# Patient Record
Sex: Female | Born: 1960 | Race: Black or African American | Hispanic: No | Smoking: Current every day smoker
Health system: Southern US, Community
[De-identification: ages and names within clinical notes are randomized; demographics above are authoritative.]

## PROBLEM LIST (undated history)

## (undated) DIAGNOSIS — I10 Essential (primary) hypertension: Secondary | ICD-10-CM

## (undated) DIAGNOSIS — I714 Abdominal aortic aneurysm, without rupture, unspecified: Secondary | ICD-10-CM

## (undated) DIAGNOSIS — R51 Headache: Secondary | ICD-10-CM

## (undated) DIAGNOSIS — J449 Chronic obstructive pulmonary disease, unspecified: Secondary | ICD-10-CM

## (undated) DIAGNOSIS — K56609 Unspecified intestinal obstruction, unspecified as to partial versus complete obstruction: Secondary | ICD-10-CM

## (undated) HISTORY — PX: COLOSTOMY REVERSAL: SHX5782

## (undated) HISTORY — PX: COLOSTOMY: SHX63

## (undated) HISTORY — PX: ABDOMINAL AORTIC ANEURYSM REPAIR: SUR1152

---

## 2003-08-12 ENCOUNTER — Other Ambulatory Visit: Payer: Self-pay

## 2012-01-03 ENCOUNTER — Emergency Department: Payer: Self-pay | Admitting: Emergency Medicine

## 2012-01-03 ENCOUNTER — Encounter (HOSPITAL_COMMUNITY): Payer: Self-pay | Admitting: Family Medicine

## 2012-01-03 ENCOUNTER — Inpatient Hospital Stay (HOSPITAL_COMMUNITY)
Admission: AD | Admit: 2012-01-03 | Discharge: 2012-01-05 | DRG: 304 | Disposition: A | Discharge status: Home / Self Care | Payer: Self-pay | Source: Other Acute Inpatient Hospital | Attending: Internal Medicine | Admitting: Internal Medicine

## 2012-01-03 DIAGNOSIS — E876 Hypokalemia: Secondary | ICD-10-CM | POA: Diagnosis present

## 2012-01-03 DIAGNOSIS — I10 Essential (primary) hypertension: Secondary | ICD-10-CM

## 2012-01-03 DIAGNOSIS — F1020 Alcohol dependence, uncomplicated: Secondary | ICD-10-CM

## 2012-01-03 DIAGNOSIS — J690 Pneumonitis due to inhalation of food and vomit: Secondary | ICD-10-CM | POA: Diagnosis present

## 2012-01-03 DIAGNOSIS — F10939 Alcohol use, unspecified with withdrawal, unspecified: Secondary | ICD-10-CM | POA: Diagnosis present

## 2012-01-03 DIAGNOSIS — Z91199 Patient's noncompliance with other medical treatment and regimen due to unspecified reason: Secondary | ICD-10-CM

## 2012-01-03 DIAGNOSIS — F10231 Alcohol dependence with withdrawal delirium: Secondary | ICD-10-CM

## 2012-01-03 DIAGNOSIS — F10239 Alcohol dependence with withdrawal, unspecified: Secondary | ICD-10-CM | POA: Diagnosis present

## 2012-01-03 DIAGNOSIS — Z9119 Patient's noncompliance with other medical treatment and regimen: Secondary | ICD-10-CM

## 2012-01-03 HISTORY — DX: Headache: R51

## 2012-01-03 HISTORY — DX: Essential (primary) hypertension: I10

## 2012-01-03 LAB — CBC WITH DIFFERENTIAL/PLATELET
Basophil #: 0 10*3/uL (ref 0.0–0.1)
Basophil %: 0.2 %
HCT: 48.5 % — ABNORMAL HIGH (ref 35.0–47.0)
HGB: 15 g/dL (ref 12.0–16.0)
MCH: 24.9 pg — ABNORMAL LOW (ref 26.0–34.0)
MCHC: 31 g/dL — ABNORMAL LOW (ref 32.0–36.0)
Neutrophil #: 12.2 10*3/uL — ABNORMAL HIGH (ref 1.4–6.5)
Platelet: 151 10*3/uL (ref 150–440)
RDW: 20.8 % — ABNORMAL HIGH (ref 11.5–14.5)

## 2012-01-03 LAB — TSH: TSH: 0.326 u[IU]/mL — ABNORMAL LOW (ref 0.350–4.500)

## 2012-01-03 LAB — RAPID URINE DRUG SCREEN, HOSP PERFORMED
Amphetamines: NOT DETECTED
Cocaine: NOT DETECTED
Opiates: NOT DETECTED
Tetrahydrocannabinol: NOT DETECTED

## 2012-01-03 LAB — DRUG SCREEN, URINE
Benzodiazepine, Ur Scrn: NEGATIVE (ref ?–200)
Cannabinoid 50 Ng, Ur ~~LOC~~: NEGATIVE (ref ?–50)
Methadone, Ur Screen: NEGATIVE (ref ?–300)
Opiate, Ur Screen: NEGATIVE (ref ?–300)
Phencyclidine (PCP) Ur S: NEGATIVE (ref ?–25)

## 2012-01-03 LAB — CBC
Hemoglobin: 14.6 g/dL (ref 12.0–15.0)
MCH: 24.7 pg — ABNORMAL LOW (ref 26.0–34.0)
RBC: 5.91 MIL/uL — ABNORMAL HIGH (ref 3.87–5.11)
WBC: 11 10*3/uL — ABNORMAL HIGH (ref 4.0–10.5)

## 2012-01-03 LAB — URINALYSIS, COMPLETE
Glucose,UR: 50 mg/dL (ref 0–75)
Hyaline Cast: 4
Leukocyte Esterase: NEGATIVE
Ph: 7 (ref 4.5–8.0)
Protein: >=500
Specific Gravity: 1.018 (ref 1.003–1.030)
WBC UR: 2 /HPF (ref 0–5)

## 2012-01-03 LAB — COMPREHENSIVE METABOLIC PANEL
Alkaline Phosphatase: 103 U/L (ref 50–136)
Bilirubin,Total: 1 mg/dL (ref 0.2–1.0)
Calcium, Total: 9.5 mg/dL (ref 8.5–10.1)
EGFR (Non-African Amer.): >60
Glucose: 214 mg/dL — ABNORMAL HIGH (ref 65–99)
SGPT (ALT): 15 U/L

## 2012-01-03 LAB — TROPONIN I: Troponin-I: 0.02 ng/mL

## 2012-01-03 LAB — PROTIME-INR: INR: 1

## 2012-01-03 LAB — ETHANOL: Ethanol: <3 mg/dL

## 2012-01-03 LAB — CK TOTAL AND CKMB (NOT AT ARMC): CK-MB: 1 ng/mL (ref 0.5–3.6)

## 2012-01-03 MED ORDER — ACETAMINOPHEN 650 MG RE SUPP: 650.0000 mg | Freq: Four times a day (QID) | RECTAL | Status: DC | PRN

## 2012-01-03 MED ORDER — HYDRALAZINE HCL 20 MG/ML IJ SOLN
10.0000 mg | Freq: Three times a day (TID) | INTRAMUSCULAR | Status: DC | PRN
  Administered 2012-01-03 – 2012-01-04 (×3): 10 mg via INTRAVENOUS
  Filled 2012-01-03 (×4): qty 0.5

## 2012-01-03 MED ORDER — SODIUM CHLORIDE 0.9 % IJ SOLN
3.0000 mL | Freq: Two times a day (BID) | INTRAMUSCULAR | Status: DC
  Administered 2012-01-03 – 2012-01-04 (×2): 3 mL via INTRAVENOUS
  Administered 2012-01-04: 09:00:00 via INTRAVENOUS

## 2012-01-03 MED ORDER — ONDANSETRON HCL 4 MG PO TABS: 4.0000 mg | ORAL_TABLET | Freq: Four times a day (QID) | ORAL | Status: DC | PRN

## 2012-01-03 MED ORDER — IPRATROPIUM BROMIDE 0.02 % IN SOLN
0.5000 mg | Freq: Four times a day (QID) | RESPIRATORY_TRACT | Status: DC
  Administered 2012-01-03 – 2012-01-05 (×7): 0.5 mg via RESPIRATORY_TRACT
  Filled 2012-01-03 (×7): qty 2.5

## 2012-01-03 MED ORDER — ONDANSETRON HCL 4 MG/2ML IJ SOLN: 4.0000 mg | Freq: Four times a day (QID) | INTRAMUSCULAR | Status: DC | PRN

## 2012-01-03 MED ORDER — THIAMINE HCL 100 MG/ML IJ SOLN
INTRAVENOUS | Status: DC
  Filled 2012-01-03 (×3): qty 1000

## 2012-01-03 MED ORDER — ALBUTEROL SULFATE (5 MG/ML) 0.5% IN NEBU
2.5000 mg | INHALATION_SOLUTION | RESPIRATORY_TRACT | Status: DC | PRN
  Administered 2012-01-03: 2.5 mg via RESPIRATORY_TRACT
  Filled 2012-01-03 (×2): qty 0.5

## 2012-01-03 MED ORDER — LORAZEPAM 2 MG/ML IJ SOLN
1.0000 mg | INTRAMUSCULAR | Status: DC | PRN
  Administered 2012-01-03: 1 mg via INTRAVENOUS
  Filled 2012-01-03: qty 1

## 2012-01-03 MED ORDER — AMLODIPINE BESYLATE 10 MG PO TABS
10.0000 mg | ORAL_TABLET | Freq: Every day | ORAL | Status: DC
  Administered 2012-01-03 – 2012-01-05 (×3): 10 mg via ORAL
  Filled 2012-01-03 (×3): qty 1

## 2012-01-03 MED ORDER — LEVOFLOXACIN IN D5W 500 MG/100ML IV SOLN
500.0000 mg | INTRAVENOUS | Status: DC
  Administered 2012-01-03 – 2012-01-04 (×2): 500 mg via INTRAVENOUS
  Filled 2012-01-03 (×2): qty 100

## 2012-01-03 MED ORDER — THIAMINE HCL 100 MG/ML IJ SOLN
Freq: Once | INTRAVENOUS | Status: AC
  Administered 2012-01-03: 13:00:00 via INTRAVENOUS
  Filled 2012-01-03 (×2): qty 1000

## 2012-01-03 MED ORDER — ACETAMINOPHEN 325 MG PO TABS
650.0000 mg | ORAL_TABLET | Freq: Four times a day (QID) | ORAL | Status: DC | PRN
  Administered 2012-01-04 – 2012-01-05 (×3): 650 mg via ORAL
  Filled 2012-01-03 (×3): qty 2

## 2012-01-03 MED ORDER — ENOXAPARIN SODIUM 40 MG/0.4ML ~~LOC~~ SOLN
40.0000 mg | Freq: Every day | SUBCUTANEOUS | Status: DC
  Administered 2012-01-03 – 2012-01-05 (×3): 40 mg via SUBCUTANEOUS
  Filled 2012-01-03 (×3): qty 0.4

## 2012-01-03 MED ORDER — CLONIDINE HCL 0.2 MG/24HR TD PTWK
0.4000 mg | MEDICATED_PATCH | TRANSDERMAL | Status: DC
  Administered 2012-01-03: 0.4 mg via TRANSDERMAL
  Filled 2012-01-03: qty 2

## 2012-01-03 MED ORDER — WHITE PETROLATUM GEL
Status: AC
  Administered 2012-01-03: 17:00:00
  Filled 2012-01-03: qty 5

## 2012-01-03 NOTE — H&P (Signed)
Jamie Roman MRN: [id number] DOB/AGE: [birth year] 51 y.o. Primary Care Physician:Pcp Not In System Admit date: 01/03/2012 Chief Complaint: Altered mental status   HPI: 51 year old female transferred from [facility], after being found unresponsive on the floor by her daughter. He is very somnolent and difficult to arouse, she does state that she drinks beer on a daily basis. However she is unable to quantify for me how much she drinks. Apparently the patient also had a bottle in her hand which was empty. She denies taking any pills yesterday. She denies any chest pain any shortness of breath her blood pressure was found to be greater than 200 systolic when she presented to [facility]. Workup in the ER included a CT scan of the head that did not show any evidence of mass or hemorrhage or acute infarction. He was also found to have a low-grade fever of 99.4 and tachycardic at a rate of 100. Was found to have some slurred speech yesterday however her speech is clear today. Ascending EtOH level was less than 3 She is obeying commands. She denies any headache or blurry vision.  Past medical history #1 hypertension #2 alcohol dependence  Past surgical history #1 laparotomy scar that the patient is unable to tell me what the surgery was for  Prior to Admission medications   Not on File    Allergies: Allergies not on file  No family history on file.  Social History:  Drinks on a daily basis Does not smoke       ROS complete 14 point review of systems was done with pertinent positives documented in history of present illness Review of Systems  Constitutional: Negative.  HENT: Negative.  Eyes: Negative.  Respiratory: Positive for shortness of breath and wheezing.  Gastrointestinal: Negative.  Genitourinary: Negative.  Musculoskeletal: Negative.  Skin: Negative.  Neurological: confusion, no slurred speech today  Endo/Heme/Allergies: Negative.    Psychiatric/Behavioral: Negative.       PHYSICAL EXAM:  Constitutional: He appears well-developed and well-nourished.  HENT:  Head: Normocephalic and atraumatic.  Right Ear: External ear normal.  Left Ear: External ear normal.  Nose: Nose normal.  Mouth/Throat: Oropharynx is clear and moist.  Eyes: Conjunctivae and EOM are normal. Pupils are equal, round, and reactive to light.  Neck: Normal range of motion. Neck supple.  Cardiovascular: Normal rate, regular rhythm, normal heart sounds and intact distal pulses.  Respiratory: Effort normal and breath sounds normal.  GI: Soft. Bowel sounds are normal.  Musculoskeletal: Normal range of motion. He exhibits no edema and no tenderness.  Neurological: He is alert. He has normal strength. No cranial nerve deficit or sensory deficit. Coordination and gait abnormal.  Skin: Skin is warm and dry.  Psychiatric: He has a normal mood and affect. His behavior is normal. Judgment and thought content normal.       Labs at [facility] #1 BUN 7 creatinine 0.76 sodium 134 potassium 3.2 chloride 94 bicarbonate 26 calcium 9.5 total bilirubin 1.0 alkaline phosphatase 103 ALT 15 AST 32 Anion gap 14 ethanol level less than 3  WBC 13.5 hemoglobin 15 hematocrit 40.5 platelet count 151  INR 1.0 Troponin 0.02    X-ray shows bilateral diffuse interstitial thickening representing chronic interstitial disease but superimposed mild interstitial edema versus pneumonitis cannot be excluded  Impression:  Principal Problem:  *HTN (hypertension) Active Problems:  Alcohol dependence  possible  aspiration pneumonitis    Plan: #1 the patient has been admitted to step down because of her hypertensive urgency #  2 she will be started on CIWA protocol for her alcohol dependence #3 her hypertension will be treated with by mouth Norvasc when necessary hydralazine #4 we'll obtain a urine drug screen #5 check electrolytes namely magnesium, potassium,  renal function #6 her abdominal pain appears to be nonspecific however we will check another lipase and liver function tests #7 altered mental status likely secondary to delerium tremens #8 leukocytosis secondary to aspiration pneumonitis we'll start the patient on Levaquin      Cj Edgell 01/03/2012, 9:59 AM

## 2012-01-03 NOTE — Progress Notes (Signed)
Notified Dr Susie Cassette [phone] that patient's BP remained elevated even after receiving Norvasc and prn hydralazine. Also informed MD that replacement IV fluids were discontinued and that nothing is available prn for agitation. Verbal order was given for clonidine, ativan and banana bag.

## 2012-01-03 NOTE — Progress Notes (Signed)
  Echocardiogram 2D Echocardiogram has been performed.  Jamie Roman, Real Cons 01/03/2012, 3:28 PM

## 2012-01-04 ENCOUNTER — Inpatient Hospital Stay (HOSPITAL_COMMUNITY): Payer: Self-pay

## 2012-01-04 DIAGNOSIS — F10231 Alcohol dependence with withdrawal delirium: Secondary | ICD-10-CM

## 2012-01-04 DIAGNOSIS — I10 Essential (primary) hypertension: Secondary | ICD-10-CM

## 2012-01-04 DIAGNOSIS — J690 Pneumonitis due to inhalation of food and vomit: Secondary | ICD-10-CM

## 2012-01-04 LAB — CBC
HCT: 42 % (ref 36.0–46.0)
Hemoglobin: 13.7 g/dL (ref 12.0–15.0)
MCH: 25.2 pg — ABNORMAL LOW (ref 26.0–34.0)
MCHC: 32.6 g/dL (ref 30.0–36.0)
MCV: 77.3 fL — ABNORMAL LOW (ref 78.0–100.0)

## 2012-01-04 MED ORDER — CLONIDINE HCL 0.3 MG/24HR TD PTWK
0.3000 mg | MEDICATED_PATCH | TRANSDERMAL | Status: DC
  Administered 2012-01-04: 0.3 mg via TRANSDERMAL
  Filled 2012-01-04: qty 1

## 2012-01-04 MED ORDER — FOLIC ACID 1 MG PO TABS
1.0000 mg | ORAL_TABLET | Freq: Every day | ORAL | Status: DC
  Administered 2012-01-04 – 2012-01-05 (×2): 1 mg via ORAL
  Filled 2012-01-04 (×2): qty 1

## 2012-01-04 MED ORDER — ADULT MULTIVITAMIN W/MINERALS CH
1.0000 | ORAL_TABLET | Freq: Every day | ORAL | Status: DC
  Administered 2012-01-04 – 2012-01-05 (×2): 1 via ORAL
  Filled 2012-01-04 (×2): qty 1

## 2012-01-04 MED ORDER — PIPERACILLIN-TAZOBACTAM 3.375 G IVPB
3.3750 g | Freq: Three times a day (TID) | INTRAVENOUS | Status: DC
  Administered 2012-01-04 – 2012-01-05 (×2): 3.375 g via INTRAVENOUS
  Filled 2012-01-04 (×6): qty 50

## 2012-01-04 MED ORDER — VITAMIN B-1 100 MG PO TABS
100.0000 mg | ORAL_TABLET | Freq: Every day | ORAL | Status: DC
  Administered 2012-01-04 – 2012-01-05 (×2): 100 mg via ORAL
  Filled 2012-01-04 (×2): qty 1

## 2012-01-04 NOTE — Care Management Note (Unsigned)
    Page 1 of 1   01/04/2012     3:58:00 PM   CARE MANAGEMENT NOTE 01/04/2012  Patient:  Jamie Roman, Jamie Roman   Account Number:  [id number]  Date Initiated:  01/04/2012  Documentation initiated by:  Letha Cape  Subjective/Objective Assessment:   dx Malignant HTN, DT's  admit- lives with boyfriend.     Action/Plan:   Anticipated DC Date:  01/06/2012   Anticipated DC Plan:  HOME/SELF CARE      DC Planning Services  CM consult      Choice offered to / List presented to:             Status of service:  In process, will continue to follow Medicare Important Message given?   (If response is "NO", the following Medicare IM given date fields will be blank) Date Medicare IM given:   Date Additional Medicare IM given:    Discharge Disposition:    Per UR Regulation:  Reviewed for med. necessity/level of care/duration of stay  If discussed at Long Length of Stay Meetings, dates discussed:    Comments:  01/04/12 15:24 Letha Cape RN, BSN [phone] patient lives with boyfriend, patient is eligible for med ast if needed. Patient states she lives in [place] and she used to go to [facility] in [place] [phone], her orange card has expired, patient lives is [place].  Patient states she has transportation at discharge.  Called the [facility] and they are closed, they closed at 1 pm on Fridays. Patient will need to see if she can get set back up with this clinic.   There is also a Artist in [place] [phone] and [facility] in [phone].

## 2012-01-04 NOTE — Progress Notes (Signed)
Subjective: Pt sitting up in bed this am, denies CP,  Denies SOB, alert appropriate. Abd. pain better, tolerating po well. Objective: Vital signs in last 24 hours: Temp:  [97.7 F (36.5 C)-100.1 F (37.8 C)] 99.5 F (37.5 C) (06/07 0410) Pulse Rate:  [85-121] 96  (06/07 0700) Resp:  [20-25] 22  (06/07 0700) BP: (147-206)/(65-87) 147/68 mmHg (06/07 0700) SpO2:  [86 %-96 %] 94 % (06/07 0700) Weight:  [71.1 kg (156 lb 12 oz)-77 kg (169 lb 12.1 oz)] 71.1 kg (156 lb 12 oz) (06/07 0030) Last BM Date: 01/02/12 Intake/Output from previous day: 06/06 0701 - 06/07 0700 In: 1075 [I.V.:975; IV Piggyback:100] Out: 250 [Urine:250] Intake/Output this shift:      General Appearance:    Alert and oriented x3, cooperative, no distress, appears stated age  Lungs:     Clear to auscultation bilaterally, respirations unlabored   Heart:    Regular rate and rhythm, S1 and S2 normal, no murmur, rub   or gallop  Abdomen:     Soft, non-tender, bowel sounds present, non distended,    no masses, no organomegaly  Extremities:  no cyanosis or edema, no tremors  Neurologic:   CNII-XII intact, normal strength, sensation and reflexes    throughout    Weight change:   Intake/Output Summary (Last 24 hours) at 01/04/12 0742 Last data filed at 01/04/12 0500  Gross per 24 hour  Intake   1075 ml  Output    250 ml  Net    825 ml    Lab Results:  No results found for this basename: NA:2,K:2,CL:2,CO2:2,GLUCOSE:2,BUN:2,CREATININE:2,CALCIUM:2 in the last 72 hours  Basename 01/04/12 0345 01/03/12 1210  WBC 12.8* 11.0*  HGB 13.7 14.6  HCT 42.0 45.2  PLT 138* 134*  MCV 77.3* 76.5*   PT/INR No results found for this basename: LABPROT:2,INR:2 in the last 72 hours ABG No results found for this basename: PHART:2,PCO2:2,PO2:2,HCO3:2 in the last 72 hours  Micro Results: Recent Results (from the past 240 hour(s))  MRSA PCR SCREENING     Status: Normal   Collection Time   01/03/12  9:06 AM      Component  Value Range Status Comment   MRSA by PCR NEGATIVE  NEGATIVE  Final    Studies/Results:  2D ECHO Study Conclusions  - Left ventricle: The cavity size was normal. There was moderate concentric hypertrophy. Systolic function was normal. The estimated ejection fraction was in the range of 55% to 60%. Wall motion was normal; there were no regional wall motion abnormalities. - Aortic valve: Mild to moderate regurgitation. - Left atrium: The atrium was mildly to moderately dilated.    No results found. Medications:  Scheduled Meds:   . amLODipine  10 mg Oral Daily  . cloNIDine  0.4 mg Transdermal Weekly  . enoxaparin  40 mg Subcutaneous Daily  . ipratropium  0.5 mg Nebulization Q6H  . levofloxacin (LEVAQUIN) IV  500 mg Intravenous Q24H  . general admission iv infusion   Intravenous Once  . general admission iv infusion   Intravenous Q24H  . sodium chloride  3 mL Intravenous Q12H  . white petrolatum       Continuous Infusions:  PRN Meds:.acetaminophen, acetaminophen, albuterol, hydrALAZINE, LORazepam, ondansetron (ZOFRAN) IV, ondansetron Assessment/Plan: Patient Active Hospital Problem List: HTN (hypertension), Malignant (01/03/2012) - better controlled d/c IVF, change clonidine patch to 0.3 weekly and continue norvasc -echo with EF 55-60%, and no wall motion abnormalities - transfer to tele  Alcohol dependence with Probable DTs -no  evidence of DTs THIS am -change to PO Thiamine folate and MVIs -SW for resources to quit alcohol - mobilize, PT/OT consult Leukocytosis/?Asp. Pneumonitis vs stress demargination - obtain CXR, UA - continue current abx and follo   dispo - transfer to tele    LOS: 1 day   Jream Broyles C 01/04/2012, 7:42 AM

## 2012-01-04 NOTE — Progress Notes (Signed)
Pt placed in wheelchair, vss, no c/o of pain, report given to  [facility] in 5500, pt escorted by NT Gaylyn Rong.

## 2012-01-04 NOTE — Progress Notes (Addendum)
ANTIBIOTIC CONSULT NOTE - INITIAL  Pharmacy Consult for Zosyn Indication: Leukocytosis/?Asp. Pneumonitis   No Known Allergies  Patient Measurements: Height: 5\' 2"  (157.5 cm) Weight: 156 lb 12 oz (71.1 kg) IBW/kg (Calculated) : 50.1    Vital Signs: Temp: 98.1 F (36.7 C) (06/07 1431) Temp src: Oral (06/07 1431) BP: 145/68 mmHg (06/07 1431) Pulse Rate: 99  (06/07 1431) Intake/Output from previous day: 06/06 0701 - 06/07 0700 In: 1075 [I.V.:975; IV Piggyback:100] Out: 250 [Urine:250] Intake/Output from this shift: Total I/O In: 240 [P.O.:240] Out: -   Labs:  Basename 01/04/12 0345 01/03/12 1210  WBC 12.8* 11.0*  HGB 13.7 14.6  PLT 138* 134*  LABCREA -- --  CREATININE -- --   CrCl is unknown because no creatinine reading has been taken. No results found for this basename: VANCOTROUGH:2,VANCOPEAK:2,VANCORANDOM:2,GENTTROUGH:2,GENTPEAK:2,GENTRANDOM:2,TOBRATROUGH:2,TOBRAPEAK:2,TOBRARND:2,AMIKACINPEAK:2,AMIKACINTROU:2,AMIKACIN:2, in the last 72 hours   Microbiology: Recent Results (from the past 720 hour(s))  MRSA PCR SCREENING     Status: Normal   Collection Time   01/03/12  9:06 AM      Component Value Range Status Comment   MRSA by PCR NEGATIVE  NEGATIVE  Final     Medical History: Past Medical History  Diagnosis Date  . Hypertension   . Headache     Medications:  Prescriptions prior to admission  Medication Sig Dispense Refill  . Aspirin-Salicylamide-Caffeine (BC HEADACHE POWDER PO) Take 1 packet by mouth daily as needed. For pain       Scheduled:    . amLODipine  10 mg Oral Daily  . cloNIDine  0.3 mg Transdermal Weekly  . enoxaparin  40 mg Subcutaneous Daily  . folic acid  1 mg Oral Daily  . ipratropium  0.5 mg Nebulization Q6H  . multivitamin with minerals  1 tablet Oral Daily  . sodium chloride  3 mL Intravenous Q12H  . thiamine  100 mg Oral Daily  . DISCONTD: cloNIDine  0.4 mg Transdermal Weekly  . DISCONTD: levofloxacin (LEVAQUIN) IV  500 mg  Intravenous Q24H  . DISCONTD: general admission iv infusion   Intravenous Q24H   Assessment: 51 y.o. Female with PMH of HTN and alcohol dependence transferred from [facility] hospital where she was admitted for AMS, HTN and leukocytosis.  She received Levaquin 500mg  IV on 6/6 and 01/04/12.  Now antibiotic changed to Zosyn.  No SCr result available.  No hx of renal insufficiency noted.   Goal of Therapy:  treat infection  Plan:  Zosyn 3.375 g IV q8hr (infuse each dose over 4 hours).  Will check serum creatinine in AM to evaluate renal function and adjust Zosyn dose if necessary.   Jamie Roman, RPh Clinical Pharmacist 01/04/2012,5:16 PM    6.8.13 Renal function stable Pharmacy to sign off  Thank you. Okey Regal, PharmD

## 2012-01-04 NOTE — Progress Notes (Signed)
Pt transferred to unit 5500 from unit 2600; report received from [place], [place].  Pt oriented to the unit and call bell system, and placed on fall precautions as a moderate fall risk.  Skin is intact with no wounds or breakdown.

## 2012-01-04 NOTE — Evaluation (Signed)
Physical Therapy Evaluation Patient Details Name: Jamie Roman MRN: [id number] DOB: 05/18/1961 Today's Date: 01/04/2012 Time: 1478-2956 PT Time Calculation (min): 23 min  PT Assessment / Plan / Recommendation Clinical Impression  Patient is a 51 yo female admitted with HTN, AMS, and ETOH abuse.  Patient was independent pta.  Today patient able to ambulate 220' with supervision and no assistive device with fairly good balance.  Encouraged patient to ambulate in hallway with nursing 2-3x/day.  No acute PT needs identified - PT will sign off.    PT Assessment  Patent does not need any further PT services (Recommend ambulation with nursing in hallway - nsg notified)    Follow Up Recommendations  No PT follow up    Barriers to Discharge        lEquipment Recommendations  None recommended by PT    Recommendations for Other Services     Frequency      Precautions / Restrictions Precautions Precautions: None Restrictions Weight Bearing Restrictions: No       Mobility  Bed Mobility Bed Mobility: Supine to Sit Supine to Sit: 7: Independent;HOB flat Transfers Transfers: Sit to Stand;Stand to Sit Sit to Stand: 7: Independent;With upper extremity assist;From bed Stand to Sit: 7: Independent;With upper extremity assist;To bed Details for Transfer Assistance: Patient steady during transitions Ambulation/Gait Ambulation/Gait Assistance: 5: Supervision Ambulation Distance (Feet): 220 Feet Assistive device: None Gait Pattern: Within Functional Limits (Slight staggering at times - able to self-correct) General Gait Details: Slight staggering at times - able to self-correct      PT Goals  N/A  Visit Information  Last PT Received On: 01/04/12 Assistance Needed: +1    Subjective Data  Subjective: Quiet.  Minimal conversation Patient Stated Goal: Daughter - No more drinking.  Patient to go home with daughter   Prior Functioning  Home Living Lives With: Significant other (Will  be going to daughter's home at discharge) Available Help at Discharge: Family Type of Home: House (Information is related to daughter's home.) Home Access: Stairs to enter Secretary/administrator of Steps: 3 Entrance Stairs-Rails: Right Home Layout: One level Bathroom Shower/Tub: Engineer, manufacturing systems: Standard Home Adaptive Equipment: None Prior Function Level of Independence: Independent Able to Take Stairs?: Yes Driving: Yes Vocation: Unemployed Comments: PTA patient was home alone during day. Communication Communication: No difficulties    Cognition  Overall Cognitive Status: Appears within functional limits for tasks assessed/performed Arousal/Alertness: Awake/alert Orientation Level: Appears intact for tasks assessed Behavior During Session: Flat affect (Quiet)    Extremity/Trunk Assessment Right Upper Extremity Assessment RUE ROM/Strength/Tone: Within functional levels Left Upper Extremity Assessment LUE ROM/Strength/Tone: Within functional levels Right Lower Extremity Assessment RLE ROM/Strength/Tone: Within functional levels Left Lower Extremity Assessment LLE ROM/Strength/Tone: Within functional levels   Balance Balance Balance Assessed: Yes High Level Balance High Level Balance Activites: Direction changes;Turns;Sudden stops;Head turns High Level Balance Comments: No loss of balance with above activities  End of Session PT - End of Session Activity Tolerance: Patient tolerated treatment well Patient left: in bed;with family/visitor present;with call bell/phone within reach (sitting on edge of bed) Nurse Communication: Mobility status (Recommended patient ambulate with nursing 2-3 x/day in hall)   Vena Austria 01/04/2012, 4:37 PM Durenda Hurt. Renaldo Fiddler, [facility] Pager [phone]

## 2012-01-05 DIAGNOSIS — J690 Pneumonitis due to inhalation of food and vomit: Secondary | ICD-10-CM

## 2012-01-05 DIAGNOSIS — I10 Essential (primary) hypertension: Secondary | ICD-10-CM

## 2012-01-05 DIAGNOSIS — F10931 Alcohol use, unspecified with withdrawal delirium: Secondary | ICD-10-CM

## 2012-01-05 DIAGNOSIS — F10231 Alcohol dependence with withdrawal delirium: Secondary | ICD-10-CM

## 2012-01-05 LAB — BASIC METABOLIC PANEL
CO2: 30 mEq/L (ref 19–32)
Calcium: 9.4 mg/dL (ref 8.4–10.5)
Chloride: 93 mEq/L — ABNORMAL LOW (ref 96–112)
GFR calc Af Amer: >90 mL/min (ref >90)
Sodium: 133 mEq/L — ABNORMAL LOW (ref 135–145)

## 2012-01-05 LAB — URINE MICROSCOPIC-ADD ON

## 2012-01-05 LAB — CBC
HCT: 40.1 % (ref 36.0–46.0)
Hemoglobin: 13 g/dL (ref 12.0–15.0)
MCHC: 32.4 g/dL (ref 30.0–36.0)
MCV: 77.9 fL — ABNORMAL LOW (ref 78.0–100.0)
RDW: 19 % — ABNORMAL HIGH (ref 11.5–15.5)

## 2012-01-05 LAB — URINALYSIS, ROUTINE W REFLEX MICROSCOPIC
Bilirubin Urine: NEGATIVE
Glucose, UA: NEGATIVE mg/dL
Hgb urine dipstick: NEGATIVE
Ketones, ur: NEGATIVE mg/dL
Protein, ur: NEGATIVE mg/dL

## 2012-01-05 MED ORDER — AMOXICILLIN-POT CLAVULANATE 875-125 MG PO TABS: 1.0000 | ORAL_TABLET | Freq: Two times a day (BID) | ORAL | Status: AC

## 2012-01-05 MED ORDER — AMLODIPINE BESYLATE 10 MG PO TABS: 10.0000 mg | ORAL_TABLET | Freq: Every day | ORAL | Status: AC

## 2012-01-05 MED ORDER — ADULT MULTIVITAMIN W/MINERALS CH: 1.0000 | ORAL_TABLET | Freq: Every day | ORAL | Status: DC

## 2012-01-05 MED ORDER — CLONIDINE HCL 0.3 MG/24HR TD PTWK: 1.0000 | MEDICATED_PATCH | TRANSDERMAL | Status: DC

## 2012-01-05 MED ORDER — THIAMINE HCL 100 MG PO TABS: 100.0000 mg | ORAL_TABLET | Freq: Every day | ORAL | Status: AC

## 2012-01-05 MED ORDER — POTASSIUM CHLORIDE CRYS ER 20 MEQ PO TBCR
60.0000 meq | EXTENDED_RELEASE_TABLET | Freq: Once | ORAL | Status: AC
  Administered 2012-01-05: 60 meq via ORAL
  Filled 2012-01-05: qty 3

## 2012-01-05 MED ORDER — AMLODIPINE BESYLATE 10 MG PO TABS: 10.0000 mg | ORAL_TABLET | Freq: Every day | ORAL | Status: DC

## 2012-01-05 MED ORDER — POTASSIUM CHLORIDE CRYS ER 20 MEQ PO TBCR: 40.0000 meq | EXTENDED_RELEASE_TABLET | Freq: Once | ORAL | Status: DC

## 2012-01-05 MED ORDER — AMOXICILLIN-POT CLAVULANATE 875-125 MG PO TABS: 1.0000 | ORAL_TABLET | Freq: Two times a day (BID) | ORAL | Status: DC

## 2012-01-05 NOTE — Progress Notes (Signed)
   CARE MANAGEMENT NOTE 01/05/2012  Patient:  Jamie Roman, Jamie Roman   Account Number:  [id number]  Date Initiated:  01/04/2012  Documentation initiated by:  Letha Cape  Subjective/Objective Assessment:   dx Malignant HTN, DT's  admit- lives with boyfriend.     Action/Plan:   Anticipated DC Date:  01/06/2012   Anticipated DC Plan:  HOME/SELF CARE      DC Planning Services  CM consult  Medication Assistance      Choice offered to / List presented to:             Status of service:  Completed, signed off Medicare Important Message given?   (If response is "NO", the following Medicare IM given date fields will be blank) Date Medicare IM given:   Date Additional Medicare IM given:    Discharge Disposition:  HOME/SELF CARE  Per UR Regulation:  Reviewed for med. necessity/level of care/duration of stay  If discussed at Long Length of Stay Meetings, dates discussed:    Comments:  01/05/2012 1300 Meds to main pharmacy for 3 day and complete dose of abx. Provided pt with community discount card and explained that the local pharmacy may accept to discount cost of out of pocket meds. NCM explained to follow up with [facility] or [facility] to see if they will accept pt without insurance coverage. Pt states she plans to apply for Medicaid. Isidoro Donning RN CCM Case Mgmt phone [phone]  01/04/12 15:24 Letha Cape RN, BSN [phone] patient lives with boyfriend, patient is eligible for med ast if needed. Patient states she lives in [place] and she used to go to [facility] in [place] [phone], her orange card has expired, patient lives is [place].  Patient states she has transportation at discharge.  Called the [facility] and they are closed, they closed at 1 pm on Fridays. Patient will need to see if she can get set back up with this clinic.   There is also a Artist in [place] [phone] and [facility] in [phone].

## 2012-01-05 NOTE — Progress Notes (Signed)
Nsg Discharge Note  Admit Date:  01/03/2012 Discharge date: 01/05/2012   Corisa Montini to be D/C'd Home per MD order.  AVS completed.  Copy for chart, and copy for patient signed, and dated. Patient/caregiver able to verbalize understanding.  Discharge Medication:  Karmyn, [place]  Home Medication Instructions [id number]   Printed on:01/05/12 1828  Medication Information                    Multiple Vitamin (MULTIVITAMIN WITH MINERALS) TABS Take 1 tablet by mouth daily.           thiamine 100 MG tablet Take 1 tablet (100 mg total) by mouth daily.           amLODipine (NORVASC) 10 MG tablet Take 1 tablet (10 mg total) by mouth daily.           amoxicillin-clavulanate (AUGMENTIN) 875-125 MG per tablet Take 1 tablet by mouth 2 (two) times daily.           cloNIDine (CATAPRES - DOSED IN MG/24 HR) 0.3 mg/24hr Place 1 patch (0.3 mg total) onto the skin once a week.             Discharge Assessment: Filed Vitals:   01/05/12 1400  BP: 128/64  Pulse: 92  Temp: 98.7 F (37.1 C)  Resp: 18   Skin clean, dry and intact without evidence of skin break down, no evidence of skin tears noted. IV catheter discontinued intact. Site without signs and symptoms of complications - no redness or edema noted at insertion site, patient denies c/o pain - only slight tenderness at site.  Dressing with slight pressure applied.  D/c Instructions-Education: Discharge instructions given to patient/family with verbalized understanding. D/c education completed with patient/family including follow up instructions, medication list, d/c activities limitations if indicated, with other d/c instructions as indicated by MD - patient able to verbalize understanding, all questions fully answered. Patient instructed to return to ED, call 911, or call MD for any changes in condition.  Patient escorted via WC, and D/C home via private auto.  Johngabriel Verde Consuella Lose, RN 01/05/2012 6:28 PM
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
## 2012-01-05 NOTE — Progress Notes (Signed)
OT Note: OT consult received and appreciated. No OT needs noted as pt. Is completing ADLs and functional mobility mod I or higher and will sign off acutely. Thanks!  Cassandria Anger, OTR/L Pager: [phone] 01/05/2012 .

## 2012-07-25 ENCOUNTER — Inpatient Hospital Stay: Payer: Self-pay | Admitting: Internal Medicine

## 2012-07-25 LAB — COMPREHENSIVE METABOLIC PANEL
Anion Gap: 8 (ref 7–16)
BUN: 19 mg/dL — ABNORMAL HIGH (ref 7–18)
Bilirubin,Total: 3.2 mg/dL — ABNORMAL HIGH (ref 0.2–1.0)
Chloride: 100 mmol/L (ref 98–107)
Co2: 30 mmol/L (ref 21–32)
Creatinine: 1.56 mg/dL — ABNORMAL HIGH (ref 0.60–1.30)
EGFR (African American): 44 — ABNORMAL LOW
Osmolality: 286 (ref 275–301)
Potassium: 3 mmol/L — ABNORMAL LOW (ref 3.5–5.1)
Total Protein: 8.2 g/dL (ref 6.4–8.2)

## 2012-07-25 LAB — CBC
Platelet: 159 10*3/uL (ref 150–440)
RBC: 6.61 10*6/uL — ABNORMAL HIGH (ref 3.80–5.20)

## 2012-07-25 LAB — URINALYSIS, COMPLETE
Nitrite: NEGATIVE
Protein: >=500
RBC,UR: 12 /HPF (ref 0–5)
Specific Gravity: 1.043 (ref 1.003–1.030)
WBC UR: 57 /HPF (ref 0–5)

## 2012-07-26 LAB — CBC WITH DIFFERENTIAL/PLATELET
Basophil #: 0 10*3/uL (ref 0.0–0.1)
Eosinophil #: 0 10*3/uL (ref 0.0–0.7)
HGB: 14.5 g/dL (ref 12.0–16.0)
Lymphocyte %: 10 %
MCH: 29.1 pg (ref 26.0–34.0)
MCHC: 32.3 g/dL (ref 32.0–36.0)
Monocyte #: 1.2 x10 3/mm — ABNORMAL HIGH (ref 0.2–0.9)
Neutrophil %: 82.6 %
Platelet: 56 10*3/uL — ABNORMAL LOW (ref 150–440)
RDW: 16.7 % — ABNORMAL HIGH (ref 11.5–14.5)

## 2012-07-26 LAB — BASIC METABOLIC PANEL
Anion Gap: 8 (ref 7–16)
BUN: 31 mg/dL — ABNORMAL HIGH (ref 7–18)
EGFR (Non-African Amer.): 21 — ABNORMAL LOW
Glucose: 114 mg/dL — ABNORMAL HIGH (ref 65–99)
Osmolality: 281 (ref 275–301)

## 2012-07-26 LAB — PROTIME-INR
INR: 1.3
Prothrombin Time: 16.1 secs — ABNORMAL HIGH (ref 11.5–14.7)

## 2012-07-26 LAB — CK TOTAL AND CKMB (NOT AT ARMC)
CK, Total: 138 U/L (ref 21–215)
CK-MB: 2.5 ng/mL (ref 0.5–3.6)
CK-MB: 5.9 ng/mL — ABNORMAL HIGH (ref 0.5–3.6)

## 2012-07-31 LAB — CULTURE, BLOOD (SINGLE)

## 2014-11-16 NOTE — Consult Note (Signed)
Brief Consult Note: Diagnosis: Abdominal pain; rectal bleeding;  AAA;  TAA.   Patient was seen by consultant.   Recommend further assessment or treatment.   Discussed with Attending MD.   Comments: The aneurysmal disease is small and even though It is a noncontrasted CT it does not appear ruptured.  However, there is significant plaque formation in the aorta and her symptoms on presentation could be consistent with mesenteric ischemia or an aortic dissection.  Her BP is under better control and her abdominal pain has resolved so I do not feel this is an emergency.  Recommend continued hydration and BP control and once a BUN/Cr have been obtained then CT angiogram of the abdomen if feasible.  Electronic Signatures: Levora DredgeSchnier, Gregory (MD)  (Signed 27-Dec-13 20:24)  Authored: Brief Consult Note   Last Updated: 27-Dec-13 20:24 by Levora DredgeSchnier, Gregory (MD)

## 2014-11-16 NOTE — Consult Note (Signed)
Brief Consult Note: Diagnosis: rectal bleeding.   Patient was seen by consultant.   Recommend further assessment or treatment.   Comments: Patient seen and examined. Full consult to follow.  Patient admitted with sob/diaphoresis and rectal bleeding.  Patietn with marked abdominal pain.  CT on admission showing thoracic and abd AA.  Patient had MRI abd today to further assess aneurysm and noted to be dissection.  Continues with generalized abdominal pain, though moreso to left of midline.  DRE shows a watery bloody effluent and a posterior anal fissure.  Question of possible ischemic bowel/mesenteric insufficiency due to above, dissection probably affecting renal arteries with declining renal fnx.  Serial cbc. No other GI recs at this time. Discussed with Dr Lorretta HarpSchneir, transfer pending to tertiary facility.  Electronic Signatures: Barnetta ChapelSkulskie, Kevontay Burks (MD)  (Signed 28-Dec-13 15:40)  Authored: Brief Consult Note   Last Updated: 28-Dec-13 15:40 by Barnetta ChapelSkulskie, Delainey Winstanley (MD)
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
## 2014-11-19 NOTE — Consult Note (Signed)
PATIENT NAME:  Jamie Roman, Shawntel A MR#:  [id number] DATE OF BIRTH:  1961/05/15  DATE OF CONSULTATION:  07/26/2012  CONSULTING PHYSICIAN:  Dr. Nemiah CommanderKalisetti   REASON FOR CONSULTATION: Unstable angina, elevation of troponin with bleeding, aortic aneurysm, and chronic kidney disease.   CHIEF COMPLAINT: Abdominal pain.  History Of Present Illness: This is a 54 year old female heavy smoker, who has had some decrease in blood count with some anemia with abdominal discomfort. The patient did have an abdominal aortic aneurysm with a potential of dissection and/or other ischemic bowel based on CT scan and current symptoms.  The patient has had relief of these symptoms after admission from the emergency room. She did have some other chest discomfort radiating from the abdominal cavity most consistent with abdominal discomfort rather than truly from true angina.  Elevation of troponin of 0.35, most consistent with demand ischemia. The patient does have some chronic kidney disease as well and mitral valve disease with 3+ mitral insufficiency murmur.   REVIEW OF SYSTEMS: The remainder review of systems negative for vision change, ringing in the ears, hearing loss, cough, congestion, heartburn, nausea, vomiting, diarrhea, bloody stools, stomach issues, syncope, dizziness, nausea, vomiting, diarrhea, urinary difficulty, skin lesions, or skin rashes.   PAST MEDICAL HISTORY:  1.  Chronic kidney disease.  2.  Anemia.  3.  Mitral valve disease.  4.  Hypertension.   FAMILY HISTORY: No family members with early onset of cardiovascular disease or hypertension.   SOCIAL HISTORY: Smokes 1 pack per day.  The patient denies alcohol use.   ALLERGIES: No known drug allergies.   CURRENT MEDICATIONS: As listed.   PHYSICAL EXAMINATION:  VITAL SIGNS: Blood pressure is 156/68 bilaterally, heart rate 72 upright, reclining, and slightly irregular.  GENERAL: She is a well-appearing female in no acute distress.  HEAD, EYES,  EARS, NOSE, AND THROAT: No icterus, thyromegaly, ulcers, hemorrhage, or xanthelasma.  CARDIOVASCULAR: Irregularly irregular with normal S1 and S2, with a 2 to 3 out of 6 apical murmur consistent with mitral regurgitation. Point of maximal impulse is diffuse. Carotid upstroke normal without bruit. Jugular venous pressure is normal.  LUNGS: Have few basilar crackles with normal respirations.  ABDOMEN: Soft, nontender, without hepatosplenomegaly.  EXTREMITIES: 2+ radial, femoral, dorsal pedal pulses with trace lower extremity edema, cyanosis, clubbing, or ulcers.  NEUROLOGIC: She is oriented to time, place, and person with normal mood and affect.   ASSESSMENT: A 54 year old female with acute onset of abdominal discomfort, anemia, mitral valve disease, aortic aneurysm, and chronic kidney disease with normal EKG having elevation of troponin consistent with demand ischemia needing further treatment options.   RECOMMENDATIONS:  1.  Continue serial ECG and enzymes to assess for possible myocardial infarction.  2.  No further cardiac intervention at this time.  3.  Echocardiogram for left ventricular systolic dysfunction.  4.  Further pursuit of abdominal discomfort and abdominal ischemia and/or aortic valve and aortic abdominal dissection and further surgical treatment as necessary depending on the emergency.  5.  Treatment with fluids for chronic kidney disease.  6.  Further treatment of malignant hypertension in light of possible aortic dissection with a beta blocker and calcium channel blocker.  7.  Further treatment options after above.   ____________________________ Lamar BlinksBruce J. Deloras Reichard, MD bjk:th D: 07/27/2012 07:57:00 ET T: 07/27/2012 20:47:05 ET JOB#: [id number]  cc: Lamar BlinksBruce J. Donaciano Range, MD, <Dictator> Lamar BlinksBRUCE J Guyla Bless MD ELECTRONICALLY SIGNED 08/14/2012 8:24

## 2014-11-19 NOTE — Discharge Summary (Signed)
PATIENT NAME:  Jamie Roman, Jamie Roman MR#:  [id number] DATE OF BIRTH:  June 17, 1961  DATE OF ADMISSION:  07/25/2012 DATE OF DISCHARGE:  07/26/2012  TRANSFERRED TO [facility] [place]: 07/26/2012  REASON FOR TRANSFER:  Abdominal aortic aneurysm dissection, needs immediate  treatment.   OTHER DIAGNOSES:  Include: 1.  Acute renal failure.  2.  Possible pneumonia.   ACCEPTING PHYSICIAN AT [facility] [place]:  Dr. Debbe Mounts.    PRESENT MEDICATIONS:  Norvasc 5 mg daily. She is on Imdur 30 mg daily, Toprol-XL 100 mg daily, Zofran 4 mg q.4 hours p.r.n. She is on Flagyl 500 mg q.8 hours, Dilaudid 1 mg q.4 hours p.r.n. for pain. IV fluids, normal saline at 60 per hour with 20 mEq KCl.    CONSULTANTS:  Nephrology consult, Dr. Wynelle Link.  Vascular surgery consult, Dr. Gilda Crease.    HOSPITAL COURSE:  This is a 54 year old female patient with history of hypertension, came in because of abdominal pain with nausea and vomiting. The patient has a past medical history of hypertension and uterine fibroids. When she came, she had trouble breathing and her blood pressure was found to be elevated at 277/115. The patient was admitted to the ICU for malignant hypertension, started on labetalol drip. She was on labetalol drip from admission till this morning and she was off the labetalol drip, continued on Toprol-XL and Norvasc and Imdur. Blood pressure well controlled with this medication. The patient's blood pressure was 118/54, heart rate 76. The patient continued to have right lower quadrant pain with some nausea. She had a CAT scan of the abdomen yesterday. This is done without contrast which showed ascending aortic aneurysm at 4.1 cm, descending thoracic aortic aneurysm at 3.9 cm, abdominal aortic aneurysm at 3.1 cm. The patient had calcification within the walls in the posterior thorax, abdominal aortic aneurysm is present. The patient has no evidence of rupture and found to have a right middle lobe atelectasis as  well. The patient was seen by Dr. Gilda Crease, vascular surgeon, yesterday on admission. He reviewed the CAT scans and he felt that is not an emergency yesterday. He recommended to continue hydration with BP control. The patient was kept in the ICU.   Hydration was given with IV fluids, normal saline at 125 mL. This morning, she continued to have abdominal pain in the right lower quadrant, so Dr. Gilda Crease repeated MRA of  the abdomen without contrast which showed there is a dissection in the abdominal aortic aneurysm which is possibly above the renal artery so Dr. Gilda Crease called me and said the patient needs to go  to  tertiary a medical center like [facility]. I called [facility] transfer center, discussed the case with Dr. Arville Go, who accepted the patient for emergency transfer. The patient did receive morphine 12 mg total from midnight to 6:00 and then she got morphine 1 mg 10:30 and 1 mg at 12:00 but her pain is still very severe in the right lower quadrant. Morphine is decreasing her blood pressure to 100/60s so we changed it to Dilaudid. The patient seen again, still has a lot of pain, so the patient is going to [facility] for AAA dissection for emergency surgery and emergency medical management.   Other diagnoses include acute renal failure. The patient's BUN showed 19, creatinine 1.56 on admission with glucose 243, potassium of 3, but BUN today is 31, creatinine 2.51 with worsening kidney function. The patient had a renal ultrasound which showed bilateral echogenic kidneys with chronic renal disease, no hydronephrosis.  The patient was seen by nephrologist, Dr. Wynelle LinkKolluru, who suggested to decrease IV fluids from 125 to 60 mL/h of normal saline. The patient had hypoxia this morning, stats were around 92%, on 4 liters it went to 97. X-ray chest  repeated showed pneumonia in the right lung. She is getting Levaquin 250 mg IV daily and continue that. Continue the oxygen. The patient has leukocytosis secondary to intra-abdominal  dissection and also pneumonia. White count is decreased today, it was 17 this morning, it was 19.6 yesterday. The patient is afebrile, blood cultures have been negative. Her hemoglobin also is stable at 14.5 this morning with hematocrit 44.9. Her vitals 2:00 are heart rate 74, blood pressure 105/54, sats 97% on 4 liters, respiratory rate 17.   DIS[facility]ITION:  [facility].   TIME SPENT ON DISCHARGE PREPARATION: More than 30 minutes.     ____________________________ Katha HammingSnehalatha Daemion Mcniel, MD sk:cs D: 07/26/2012 15:57:00 ET T: 07/27/2012 18:55:30 ET JOB#: [id number]  cc: Katha HammingSnehalatha Tyrese Ficek, MD, <Dictator> Katha HammingSNEHALATHA Prestyn Stanco MD ELECTRONICALLY SIGNED 08/25/2012 8:27 Katha HammingSNEHALATHA Wadell Craddock MD ELECTRONICALLY SIGNED 08/25/2012 8:52

## 2017-01-14 ENCOUNTER — Other Ambulatory Visit: Payer: Self-pay | Admitting: Family Medicine

## 2017-01-14 DIAGNOSIS — Z1231 Encounter for screening mammogram for malignant neoplasm of breast: Secondary | ICD-10-CM

## 2018-02-03 ENCOUNTER — Other Ambulatory Visit: Payer: Self-pay | Admitting: Family Medicine

## 2018-02-03 DIAGNOSIS — Z1231 Encounter for screening mammogram for malignant neoplasm of breast: Secondary | ICD-10-CM

## 2018-03-05 ENCOUNTER — Ambulatory Visit
Admission: RE | Admit: 2018-03-05 | Discharge: 2018-03-05 | Disposition: A | Discharge status: Home / Self Care | Payer: Medicaid Other | Source: Ambulatory Visit | Attending: Family Medicine | Admitting: Family Medicine

## 2018-03-05 DIAGNOSIS — Z1231 Encounter for screening mammogram for malignant neoplasm of breast: Secondary | ICD-10-CM

## 2018-03-17 ENCOUNTER — Other Ambulatory Visit: Payer: Self-pay | Admitting: *Deleted

## 2018-03-17 ENCOUNTER — Inpatient Hospital Stay
Admission: RE | Admit: 2018-03-17 | Discharge: 2018-03-17 | Disposition: A | Discharge status: Home / Self Care | Payer: Self-pay | Source: Ambulatory Visit | Attending: *Deleted | Admitting: *Deleted

## 2018-03-17 DIAGNOSIS — Z9289 Personal history of other medical treatment: Secondary | ICD-10-CM

## 2019-07-02 ENCOUNTER — Other Ambulatory Visit: Payer: Self-pay | Admitting: Family Medicine

## 2019-07-02 DIAGNOSIS — Z1231 Encounter for screening mammogram for malignant neoplasm of breast: Secondary | ICD-10-CM

## 2019-07-28 ENCOUNTER — Ambulatory Visit
Admission: RE | Admit: 2019-07-28 | Discharge: 2019-07-28 | Disposition: A | Discharge status: Home / Self Care | Payer: Medicaid Other | Source: Ambulatory Visit | Attending: Family Medicine | Admitting: Family Medicine

## 2019-07-28 DIAGNOSIS — Z1231 Encounter for screening mammogram for malignant neoplasm of breast: Secondary | ICD-10-CM | POA: Diagnosis present

## 2019-08-30 ENCOUNTER — Emergency Department: Payer: Medicaid Other

## 2019-08-30 ENCOUNTER — Other Ambulatory Visit: Payer: Self-pay

## 2019-08-30 ENCOUNTER — Encounter: Payer: Self-pay | Admitting: Emergency Medicine

## 2019-08-30 ENCOUNTER — Inpatient Hospital Stay
Admission: EM | Admit: 2019-08-30 | Discharge: 2019-09-04 | DRG: 492 | Disposition: A | Discharge status: Home Health | Payer: Medicaid Other | Attending: Internal Medicine | Admitting: Internal Medicine

## 2019-08-30 DIAGNOSIS — I739 Peripheral vascular disease, unspecified: Secondary | ICD-10-CM | POA: Diagnosis present

## 2019-08-30 DIAGNOSIS — I712 Thoracic aortic aneurysm, without rupture, unspecified: Secondary | ICD-10-CM | POA: Diagnosis present

## 2019-08-30 DIAGNOSIS — I493 Ventricular premature depolarization: Secondary | ICD-10-CM | POA: Diagnosis not present

## 2019-08-30 DIAGNOSIS — Z79899 Other long term (current) drug therapy: Secondary | ICD-10-CM | POA: Diagnosis not present

## 2019-08-30 DIAGNOSIS — Y92008 Other place in unspecified non-institutional (private) residence as the place of occurrence of the external cause: Secondary | ICD-10-CM

## 2019-08-30 DIAGNOSIS — S82841A Displaced bimalleolar fracture of right lower leg, initial encounter for closed fracture: Principal | ICD-10-CM | POA: Diagnosis present

## 2019-08-30 DIAGNOSIS — J9811 Atelectasis: Secondary | ICD-10-CM | POA: Diagnosis present

## 2019-08-30 DIAGNOSIS — Z7982 Long term (current) use of aspirin: Secondary | ICD-10-CM | POA: Diagnosis not present

## 2019-08-30 DIAGNOSIS — Z8679 Personal history of other diseases of the circulatory system: Secondary | ICD-10-CM

## 2019-08-30 DIAGNOSIS — I472 Ventricular tachycardia, unspecified: Secondary | ICD-10-CM

## 2019-08-30 DIAGNOSIS — I340 Nonrheumatic mitral (valve) insufficiency: Secondary | ICD-10-CM | POA: Diagnosis not present

## 2019-08-30 DIAGNOSIS — I352 Nonrheumatic aortic (valve) stenosis with insufficiency: Secondary | ICD-10-CM | POA: Diagnosis present

## 2019-08-30 DIAGNOSIS — R9431 Abnormal electrocardiogram [ECG] [EKG]: Secondary | ICD-10-CM | POA: Diagnosis not present

## 2019-08-30 DIAGNOSIS — F17210 Nicotine dependence, cigarettes, uncomplicated: Secondary | ICD-10-CM | POA: Diagnosis present

## 2019-08-30 DIAGNOSIS — Z20822 Contact with and (suspected) exposure to covid-19: Secondary | ICD-10-CM | POA: Diagnosis present

## 2019-08-30 DIAGNOSIS — J9621 Acute and chronic respiratory failure with hypoxia: Secondary | ICD-10-CM | POA: Diagnosis present

## 2019-08-30 DIAGNOSIS — I10 Essential (primary) hypertension: Secondary | ICD-10-CM

## 2019-08-30 DIAGNOSIS — R0902 Hypoxemia: Secondary | ICD-10-CM

## 2019-08-30 DIAGNOSIS — S82891A Other fracture of right lower leg, initial encounter for closed fracture: Secondary | ICD-10-CM | POA: Insufficient documentation

## 2019-08-30 DIAGNOSIS — I371 Nonrheumatic pulmonary valve insufficiency: Secondary | ICD-10-CM | POA: Diagnosis present

## 2019-08-30 DIAGNOSIS — I350 Nonrheumatic aortic (valve) stenosis: Secondary | ICD-10-CM | POA: Diagnosis not present

## 2019-08-30 DIAGNOSIS — R0600 Dyspnea, unspecified: Secondary | ICD-10-CM

## 2019-08-30 DIAGNOSIS — I351 Nonrheumatic aortic (valve) insufficiency: Secondary | ICD-10-CM | POA: Diagnosis not present

## 2019-08-30 DIAGNOSIS — Z7951 Long term (current) use of inhaled steroids: Secondary | ICD-10-CM | POA: Diagnosis not present

## 2019-08-30 DIAGNOSIS — E785 Hyperlipidemia, unspecified: Secondary | ICD-10-CM | POA: Diagnosis present

## 2019-08-30 DIAGNOSIS — J441 Chronic obstructive pulmonary disease with (acute) exacerbation: Secondary | ICD-10-CM | POA: Diagnosis present

## 2019-08-30 DIAGNOSIS — J9601 Acute respiratory failure with hypoxia: Secondary | ICD-10-CM | POA: Diagnosis present

## 2019-08-30 DIAGNOSIS — T17890A Other foreign object in other parts of respiratory tract causing asphyxiation, initial encounter: Secondary | ICD-10-CM | POA: Diagnosis present

## 2019-08-30 DIAGNOSIS — W002XXA Other fall from one level to another due to ice and snow, initial encounter: Secondary | ICD-10-CM | POA: Diagnosis present

## 2019-08-30 DIAGNOSIS — I2510 Atherosclerotic heart disease of native coronary artery without angina pectoris: Secondary | ICD-10-CM | POA: Diagnosis present

## 2019-08-30 DIAGNOSIS — J432 Centrilobular emphysema: Secondary | ICD-10-CM | POA: Diagnosis not present

## 2019-08-30 HISTORY — DX: Abdominal aortic aneurysm, without rupture, unspecified: I71.40

## 2019-08-30 HISTORY — DX: Chronic obstructive pulmonary disease, unspecified: J44.9

## 2019-08-30 HISTORY — DX: Unspecified intestinal obstruction, unspecified as to partial versus complete obstruction: K56.609

## 2019-08-30 HISTORY — DX: Abdominal aortic aneurysm, without rupture: I71.4

## 2019-08-30 LAB — PROTIME-INR
INR: 1.1 (ref 0.8–1.2)
Prothrombin Time: 13.7 seconds (ref 11.4–15.2)

## 2019-08-30 LAB — BRAIN NATRIURETIC PEPTIDE: B Natriuretic Peptide: 75 pg/mL (ref 0.0–100.0)

## 2019-08-30 LAB — BLOOD GAS, ARTERIAL
Acid-base deficit: 2.2 mmol/L — ABNORMAL HIGH (ref 0.0–2.0)
Bicarbonate: 23.7 mmol/L (ref 20.0–28.0)
FIO2: 44
O2 Saturation: 92.7 %
Patient temperature: 37
pCO2 arterial: 44 mmHg (ref 32.0–48.0)
pH, Arterial: 7.34 — ABNORMAL LOW (ref 7.350–7.450)
pO2, Arterial: 70 mmHg — ABNORMAL LOW (ref 83.0–108.0)

## 2019-08-30 LAB — CBC WITH DIFFERENTIAL/PLATELET
Abs Immature Granulocytes: 0.02 10*3/uL (ref 0.00–0.07)
Basophils Absolute: 0 10*3/uL (ref 0.0–0.1)
Basophils Relative: 0 %
Eosinophils Absolute: 0 10*3/uL (ref 0.0–0.5)
Eosinophils Relative: 0 %
HCT: 39 % (ref 36.0–46.0)
Hemoglobin: 12.3 g/dL (ref 12.0–15.0)
Immature Granulocytes: 0 %
Lymphocytes Relative: 30 %
Lymphs Abs: 2.8 10*3/uL (ref 0.7–4.0)
MCH: 26.1 pg (ref 26.0–34.0)
MCHC: 31.5 g/dL (ref 30.0–36.0)
MCV: 82.8 fL (ref 80.0–100.0)
Monocytes Absolute: 0.8 10*3/uL (ref 0.1–1.0)
Monocytes Relative: 8 %
Neutro Abs: 5.7 10*3/uL (ref 1.7–7.7)
Neutrophils Relative %: 62 %
Platelets: 215 10*3/uL (ref 150–400)
RBC: 4.71 MIL/uL (ref 3.87–5.11)
RDW: 16.4 % — ABNORMAL HIGH (ref 11.5–15.5)
WBC: 9.3 10*3/uL (ref 4.0–10.5)
nRBC: 0 % (ref 0.0–0.2)

## 2019-08-30 LAB — COMPREHENSIVE METABOLIC PANEL
ALT: 15 U/L (ref 0–44)
AST: 31 U/L (ref 15–41)
Albumin: 3.1 g/dL — ABNORMAL LOW (ref 3.5–5.0)
Alkaline Phosphatase: 87 U/L (ref 38–126)
Anion gap: 11 (ref 5–15)
BUN: 33 mg/dL — ABNORMAL HIGH (ref 6–20)
CO2: 26 mmol/L (ref 22–32)
Calcium: 8.6 mg/dL — ABNORMAL LOW (ref 8.9–10.3)
Chloride: 98 mmol/L (ref 98–111)
Creatinine, Ser: 1.42 mg/dL — ABNORMAL HIGH (ref 0.44–1.00)
GFR calc Af Amer: 46 mL/min — ABNORMAL LOW (ref >60)
GFR calc non Af Amer: 40 mL/min — ABNORMAL LOW (ref >60)
Glucose, Bld: 76 mg/dL (ref 70–99)
Potassium: 3.6 mmol/L (ref 3.5–5.1)
Sodium: 135 mmol/L (ref 135–145)
Total Bilirubin: 0.9 mg/dL (ref 0.3–1.2)
Total Protein: 6.9 g/dL (ref 6.5–8.1)

## 2019-08-30 LAB — POC SARS CORONAVIRUS 2 AG: SARS Coronavirus 2 Ag: NEGATIVE

## 2019-08-30 LAB — APTT: aPTT: 31 seconds (ref 24–36)

## 2019-08-30 LAB — SARS CORONAVIRUS 2 (TAT 6-24 HRS): SARS Coronavirus 2: NEGATIVE

## 2019-08-30 LAB — TROPONIN I (HIGH SENSITIVITY): Troponin I (High Sensitivity): 17 ng/L (ref <18)

## 2019-08-30 MED ORDER — SODIUM CHLORIDE 0.9 % IV SOLN: INTRAVENOUS | Status: DC

## 2019-08-30 MED ORDER — CHLORHEXIDINE GLUCONATE CLOTH 2 % EX PADS
6.0000 | MEDICATED_PAD | Freq: Every day | CUTANEOUS | Status: DC
  Administered 2019-08-31: 6 via TOPICAL

## 2019-08-30 MED ORDER — FLEET ENEMA 7-19 GM/118ML RE ENEM: 1.0000 | ENEMA | Freq: Once | RECTAL | Status: DC | PRN

## 2019-08-30 MED ORDER — SODIUM CHLORIDE 0.9% FLUSH
3.0000 mL | Freq: Two times a day (BID) | INTRAVENOUS | Status: DC
  Administered 2019-08-31 – 2019-09-04 (×9): 3 mL via INTRAVENOUS

## 2019-08-30 MED ORDER — ALBUTEROL SULFATE (2.5 MG/3ML) 0.083% IN NEBU: 2.5000 mg | INHALATION_SOLUTION | RESPIRATORY_TRACT | Status: DC | PRN

## 2019-08-30 MED ORDER — DOXYCYCLINE HYCLATE 100 MG PO TABS
100.0000 mg | ORAL_TABLET | Freq: Two times a day (BID) | ORAL | Status: DC
  Administered 2019-08-30 – 2019-09-04 (×9): 100 mg via ORAL
  Filled 2019-08-30 (×9): qty 1

## 2019-08-30 MED ORDER — ALBUTEROL SULFATE (2.5 MG/3ML) 0.083% IN NEBU
2.5000 mg | INHALATION_SOLUTION | Freq: Four times a day (QID) | RESPIRATORY_TRACT | Status: DC
  Administered 2019-08-30: 2.5 mg via RESPIRATORY_TRACT
  Filled 2019-08-30: qty 3

## 2019-08-30 MED ORDER — BACID PO TABS
2.0000 | ORAL_TABLET | Freq: Two times a day (BID) | ORAL | Status: DC
  Filled 2019-08-30 (×6): qty 2

## 2019-08-30 MED ORDER — SENNA 8.6 MG PO TABS
1.0000 | ORAL_TABLET | Freq: Two times a day (BID) | ORAL | Status: DC
  Administered 2019-08-30 – 2019-08-31 (×2): 8.6 mg via ORAL
  Filled 2019-08-30 (×3): qty 1

## 2019-08-30 MED ORDER — HEPARIN SODIUM (PORCINE) 5000 UNIT/ML IJ SOLN
5000.0000 [IU] | Freq: Three times a day (TID) | INTRAMUSCULAR | Status: DC
  Administered 2019-08-30 – 2019-08-31 (×3): 5000 [IU] via SUBCUTANEOUS
  Filled 2019-08-30 (×3): qty 1

## 2019-08-30 MED ORDER — ONDANSETRON HCL 4 MG/2ML IJ SOLN
4.0000 mg | Freq: Once | INTRAMUSCULAR | Status: AC
  Administered 2019-08-30: 16:00:00 4 mg via INTRAVENOUS
  Filled 2019-08-30: qty 2

## 2019-08-30 MED ORDER — METHYLPREDNISOLONE SODIUM SUCC 125 MG IJ SOLR: 80.0000 mg | Freq: Once | INTRAMUSCULAR | Status: DC

## 2019-08-30 MED ORDER — METHYLPREDNISOLONE SODIUM SUCC 125 MG IJ SOLR
80.0000 mg | Freq: Once | INTRAMUSCULAR | Status: AC
  Administered 2019-08-30: 80 mg via INTRAVENOUS
  Filled 2019-08-30: qty 2

## 2019-08-30 MED ORDER — OXYCODONE HCL 5 MG PO TABS
5.0000 mg | ORAL_TABLET | Freq: Once | ORAL | Status: AC
  Administered 2019-08-30: 5 mg via ORAL
  Filled 2019-08-30: qty 1

## 2019-08-30 MED ORDER — MORPHINE SULFATE (PF) 2 MG/ML IV SOLN
2.0000 mg | INTRAVENOUS | Status: DC | PRN
  Administered 2019-08-30 – 2019-09-01 (×4): 2 mg via INTRAVENOUS
  Filled 2019-08-30 (×4): qty 1

## 2019-08-30 MED ORDER — MORPHINE SULFATE (PF) 4 MG/ML IV SOLN
4.0000 mg | Freq: Once | INTRAVENOUS | Status: AC
  Administered 2019-08-30: 4 mg via INTRAVENOUS
  Filled 2019-08-30: qty 1

## 2019-08-30 MED ORDER — ACETAMINOPHEN 650 MG RE SUPP: 650.0000 mg | Freq: Four times a day (QID) | RECTAL | Status: DC | PRN

## 2019-08-30 MED ORDER — MAGNESIUM HYDROXIDE 400 MG/5ML PO SUSP
30.0000 mL | Freq: Every day | ORAL | Status: DC | PRN
  Administered 2019-09-03: 30 mL via ORAL
  Filled 2019-08-30: qty 30

## 2019-08-30 MED ORDER — ALBUTEROL SULFATE HFA 108 (90 BASE) MCG/ACT IN AERS: 2.0000 | INHALATION_SPRAY | Freq: Once | RESPIRATORY_TRACT | Status: DC

## 2019-08-30 MED ORDER — ONDANSETRON HCL 4 MG/2ML IJ SOLN
4.0000 mg | Freq: Four times a day (QID) | INTRAMUSCULAR | Status: DC | PRN
  Administered 2019-09-01: 4 mg via INTRAVENOUS

## 2019-08-30 MED ORDER — OXYCODONE HCL 5 MG PO TABS
5.0000 mg | ORAL_TABLET | ORAL | Status: DC | PRN
  Administered 2019-08-31 (×2): 5 mg via ORAL
  Administered 2019-08-31 – 2019-09-01 (×3): 10 mg via ORAL
  Filled 2019-08-30: qty 2
  Filled 2019-08-30: qty 1
  Filled 2019-08-30 (×2): qty 2

## 2019-08-30 MED ORDER — BISACODYL 5 MG PO TBEC: 10.0000 mg | DELAYED_RELEASE_TABLET | Freq: Every day | ORAL | Status: DC | PRN

## 2019-08-30 MED ORDER — CEFAZOLIN SODIUM-DEXTROSE 2-4 GM/100ML-% IV SOLN
2.0000 g | INTRAVENOUS | Status: AC
  Administered 2019-09-01: 2 g via INTRAVENOUS
  Filled 2019-08-30: qty 100

## 2019-08-30 MED ORDER — GUAIFENESIN ER 600 MG PO TB12
600.0000 mg | ORAL_TABLET | Freq: Two times a day (BID) | ORAL | Status: DC
  Administered 2019-08-30 – 2019-09-04 (×9): 600 mg via ORAL
  Filled 2019-08-30 (×9): qty 1

## 2019-08-30 MED ORDER — METHOCARBAMOL 1000 MG/10ML IJ SOLN
500.0000 mg | Freq: Four times a day (QID) | INTRAVENOUS | Status: DC | PRN
  Filled 2019-08-30: qty 5

## 2019-08-30 MED ORDER — SODIUM CHLORIDE 0.9 % IV SOLN
1.0000 g | INTRAVENOUS | Status: DC
  Administered 2019-08-30 – 2019-09-03 (×5): 1 g via INTRAVENOUS
  Filled 2019-08-30 (×3): qty 10
  Filled 2019-08-30: qty 1
  Filled 2019-08-30 (×2): qty 10

## 2019-08-30 MED ORDER — ACETAMINOPHEN 325 MG PO TABS
650.0000 mg | ORAL_TABLET | Freq: Four times a day (QID) | ORAL | Status: DC | PRN
  Administered 2019-08-30 – 2019-08-31 (×2): 650 mg via ORAL
  Filled 2019-08-30 (×2): qty 2

## 2019-08-30 MED ORDER — IOHEXOL 350 MG/ML SOLN
75.0000 mL | Freq: Once | INTRAVENOUS | Status: AC | PRN
  Administered 2019-08-30: 75 mL via INTRAVENOUS

## 2019-08-30 MED ORDER — ONDANSETRON HCL 4 MG PO TABS: 4.0000 mg | ORAL_TABLET | Freq: Four times a day (QID) | ORAL | Status: DC | PRN

## 2019-08-30 MED ORDER — METHOCARBAMOL 500 MG PO TABS
500.0000 mg | ORAL_TABLET | Freq: Four times a day (QID) | ORAL | Status: DC | PRN
  Filled 2019-08-30: qty 1

## 2019-08-30 MED ORDER — DOCUSATE SODIUM 100 MG PO CAPS
100.0000 mg | ORAL_CAPSULE | Freq: Two times a day (BID) | ORAL | Status: DC
  Administered 2019-08-30 – 2019-08-31 (×3): 100 mg via ORAL
  Filled 2019-08-30 (×2): qty 1

## 2019-08-30 NOTE — H&P (Signed)
PREOPERATIVE H&P  Chief Complaint: Right ankle pain status post fall  HPI: Jamie Roman is a 59 y.o. female who presents to the [facility] regional emergency department after a fall causing injury to her right ankle.  Patient denies other injuries.  She denies chest pain, shortness of breath, dizziness or nausea and vomiting.  Patient states she suffered a mechanical fall at home.  Radiographs in the emergency department have revealed a bimalleolar ankle fracture with lateral talar subluxation.  Orthopedics was consulted for management of her right ankle fracture.  Past Medical History:  Diagnosis Date  . AAA (abdominal aortic aneurysm) ([place])   . Bowel obstruction ([place])   . COPD (chronic obstructive pulmonary disease) ([place])   . Headache(784.0)   . Hypertension    Past Surgical History:  Procedure Laterality Date  . ABDOMINAL AORTIC ANEURYSM REPAIR    . COLOSTOMY    . COLOSTOMY REVERSAL     Social History   Socioeconomic History  . Marital status: Unknown    Spouse name: Not on file  . Number of children: Not on file  . Years of education: Not on file  . Highest education level: Not on file  Occupational History  . Not on file  Tobacco Use  . Smoking status: Current Every Day Smoker    Packs/day: 0.50    Years: 15.00    Pack years: 7.50    Types: Cigarettes  . Smokeless tobacco: Never Used  Substance and Sexual Activity  . Alcohol use: Yes    Comment: 1 quart  . Drug use: Not on file  . Sexual activity: Yes  Other Topics Concern  . Not on file  Social History Narrative  . Not on file   Social Determinants of Health   Financial Resource Strain:   . Difficulty of Paying Living Expenses: Not on file  Food Insecurity:   . Worried About Charity fundraiser in the Last Year: Not on file  . Ran Out of Food in the Last Year: Not on file  Transportation Needs:   . Lack of Transportation (Medical): Not on file  . Lack of Transportation (Non-Medical): Not on file   Physical Activity:   . Days of Exercise per Week: Not on file  . Minutes of Exercise per Session: Not on file  Stress:   . Feeling of Stress : Not on file  Social Connections:   . Frequency of Communication with Friends and Family: Not on file  . Frequency of Social Gatherings with Friends and Family: Not on file  . Attends Religious Services: Not on file  . Active Member of Clubs or Organizations: Not on file  . Attends Archivist Meetings: Not on file  . Marital Status: Not on file   History reviewed. No pertinent family history. No Known Allergies Prior to Admission medications   Medication Sig Start Date End Date Taking? Authorizing Provider  amLODipine (NORVASC) 10 MG tablet Take 1 tablet (10 mg total) by mouth daily. 01/05/12 01/04/13  Viyuoh, Alison Stalling, MD  cloNIDine (CATAPRES - DOSED IN MG/24 HR) 0.3 mg/24hr Place 1 patch (0.3 mg total) onto the skin once a week. 01/05/12 01/04/13  Sheila Oats, MD  Multiple Vitamin (MULTIVITAMIN WITH MINERALS) TABS Take 1 tablet by mouth daily. 01/05/12   Viyuoh, Alison Stalling, MD     Positive ROS: All other systems have been reviewed and were otherwise negative with the exception of those mentioned in the HPI and as above.  Physical Exam: General: Alert, no acute distress Cardiovascular: Regular rate and rhythm, no murmurs rubs or gallops.  No pedal edema Respiratory: Clear to auscultation bilaterally, no wheezes rales or rhonchi. No cyanosis, no use of accessory musculature GI: No organomegaly, abdomen is soft and non-tender nondistended with positive bowel sounds. Skin: Skin intact, no lesions within the operative field. Neurologic: Sensation intact distally Psychiatric: Patient is competent for consent with normal mood and affect Lymphatic: No cervical lymphadenopathy  MUSCULOSKELETAL: Right lower extremity: Patient's ankle is in a posterior splint.  Her skin is intact.  She is intact sensation light touch in all 5 digits of the  right foot.  Her toes are well-perfused.  Her foot and leg compartments are soft and compressible.  There is no significant deformity.  Assessment: Right, closed bimalleolar ankle fracture  Plan: I reviewed the patient's radiographs.  She has lateral subluxation of the talus with displacement of the lateral malleolus.  There is also comminuted fracture of the distal tip of the medial malleolus.  Patient is being admitted to the hospital for pain control.  The hospitalist service will be consulted for preop clearance.  Patient will be n.p.o. after midnight in preparation for possible ORIF of the right ankle tomorrow pending medical clearance.  I discussed the risks and benefits of surgery. The risks include but are not limited to infection, bleeding, nerve or blood vessel injury, joint stiffness or loss of motion, persistent pain, weakness or instability, malunion, nonunion and hardware failure and the need for further surgery. Medical risks include but are not limited to DVT and pulmonary embolism, myocardial infarction, stroke, pneumonia, respiratory failure and death. Patient understood these risks and wished to proceed.     Juanell Fairly, MD   08/30/2019 7:10 PM

## 2019-08-30 NOTE — ED Triage Notes (Signed)
Pt to ED via POV for fall this AM. Pt is having pain and swelling in her right foot. Pt is in NAD.

## 2019-08-30 NOTE — ED Provider Notes (Signed)
[facility] Emergency Department Provider Note ____________________________________________   None    (approximate)  I have reviewed the triage vital signs and the nursing notes.   HISTORY  Chief Complaint Fall and Foot Pain  HPI Jamie Roman is a 60 y.o. female presents to the emergency department after sustaining a mechanical, nonsyncopal fall this morning at home.  Patient states that she was going outside and slipped on the icy porch.  She states that she fell backward off the porch that was approximately 4 feet off the ground.  She landed on her back.  Her main complaint is right lower extremity pain and swelling.  She denies experiencing any loss of consciousness and she denies striking her head during the fall.  No alleviating measures attempted prior to arrival.  Triage noted her oxygen saturation to be in the 80s and applied 2 L of oxygen by nasal cannula.  Oxygen saturation is now 94%.  Past Medical History:  Diagnosis Date  . AAA (abdominal aortic aneurysm) (HCC)   . Bowel obstruction (HCC)   . COPD (chronic obstructive pulmonary disease) (HCC)   . Headache(784.0)   . Hypertension     Patient Active Problem List   Diagnosis Date Noted  . Alcohol dependence (HCC) 01/03/2012  . HTN (hypertension) 01/03/2012    Past Surgical History:  Procedure Laterality Date  . ABDOMINAL AORTIC ANEURYSM REPAIR    . COLOSTOMY    . COLOSTOMY REVERSAL      Prior to Admission medications   Medication Sig Start Date End Date Taking? Authorizing Provider  amLODipine (NORVASC) 10 MG tablet Take 1 tablet (10 mg total) by mouth daily. 01/05/12 01/04/13  Viyuoh, Rolland Bimler, MD  cloNIDine (CATAPRES - DOSED IN MG/24 HR) 0.3 mg/24hr Place 1 patch (0.3 mg total) onto the skin once a week. 01/05/12 01/04/13  Kela Millin, MD  Multiple Vitamin (MULTIVITAMIN WITH MINERALS) TABS Take 1 tablet by mouth daily. 01/05/12   Kela Millin, MD    Allergies Patient has  no known allergies.  History reviewed. No pertinent family history.  Social History Social History   Tobacco Use  . Smoking status: Current Every Day Smoker    Packs/day: 0.50    Years: 15.00    Pack years: 7.50    Types: Cigarettes  . Smokeless tobacco: Never Used  Substance Use Topics  . Alcohol use: Yes    Comment: 1 quart  . Drug use: Not on file    Review of Systems  Constitutional: No fever/chills Eyes: No visual changes. ENT: No sore throat. Cardiovascular: Denies chest pain. Respiratory: Denies shortness of breath. Gastrointestinal: No abdominal pain.  No nausea, no vomiting.  No diarrhea.  No constipation. Genitourinary: Negative for dysuria. Musculoskeletal: Negative for back pain. Skin: Negative for rash. Neurological: Negative for headaches, focal weakness or numbness. ____________________________________________   PHYSICAL EXAM:  VITAL SIGNS: ED Triage Vitals  Enc Vitals Group     BP 08/30/19 1322 (!) 109/54     Pulse Rate 08/30/19 1322 70     Resp 08/30/19 1322 19     Temp 08/30/19 1322 98.5 F (36.9 C)     Temp Source 08/30/19 1322 Oral     SpO2 08/30/19 1322 95 %     Weight 08/30/19 1309 150 lb (68 kg)     Height 08/30/19 1309 5\' 2"  (1.575 m)     Head Circumference --      Peak Flow --  Pain Score 08/30/19 1309 8     Pain Loc --      Pain Edu? --      Excl. in GC? --     Constitutional: Alert and oriented. Well appearing and in no acute distress. Eyes: Conjunctivae are normal. PERRL. EOMI. Head: Atraumatic. Nose: No congestion/rhinnorhea. Mouth/Throat: Mucous membranes are moist.  Oropharynx non-erythematous. Neck: No stridor.   Hematological/Lymphatic/Immunilogical: No cervical lymphadenopathy. Cardiovascular: Normal rate, regular rhythm. Grossly normal heart sounds.  Good peripheral circulation. Respiratory: Normal respiratory effort.  No retractions. Lungs CTAB. Gastrointestinal: Soft and nontender. No distention. No abdominal  bruits. No CVA tenderness. Genitourinary:  Musculoskeletal: No lower extremity tenderness nor edema.  No joint effusions. Neurologic:  Normal speech and language. No gross focal neurologic deficits are appreciated. No gait instability. Skin:  Skin is warm, dry and intact. No rash noted. Psychiatric: Mood and affect are normal. Speech and behavior are normal.  ____________________________________________   LABS (all labs ordered are listed, but only abnormal results are displayed)  Labs Reviewed  CBC WITH DIFFERENTIAL/PLATELET - Abnormal; Notable for the following components:      Result Value   RDW 16.4 (*)    All other components within normal limits  COMPREHENSIVE METABOLIC PANEL - Abnormal; Notable for the following components:   BUN 33 (*)    Creatinine, Ser 1.42 (*)    Calcium 8.6 (*)    Albumin 3.1 (*)    GFR calc non Af Amer 40 (*)    GFR calc Af Amer 46 (*)    All other components within normal limits  SARS CORONAVIRUS 2 (TAT 6-24 HRS)  BRAIN NATRIURETIC PEPTIDE  PROTIME-INR  POC SARS CORONAVIRUS 2 AG -  ED  POC SARS CORONAVIRUS 2 AG  TROPONIN I (HIGH SENSITIVITY)   ____________________________________________  EKG  ____________________________________________  RADIOLOGY  ED MD interpretation:    Displaced distal fibula fracture and comminuted fracture of the medial malleolus.  I, Kem Boroughs, personally viewed and evaluated these images (plain radiographs) as part of my medical decision making, as well as reviewing the written report by the radiologist.  Official radiology report(s): DG Tibia/Fibula Right  Result Date: 08/30/2019 CLINICAL DATA:  Pain after fall. Deformity right ankle. EXAM: RIGHT TIBIA AND FIBULA - 2 VIEW COMPARISON:  None. FINDINGS: Ankle fractures are identified. Please see the ankle films for full description. The remainder of the tibia and fibular intact. IMPRESSION: Ankle fractures are identified. These will be described on the  dedicated ankle films. The remainder of the tibia and fibular intact. The distal femur is intact. Electronically Signed   By: Gerome Sam III M.D   On: 08/30/2019 14:56   DG Ankle Complete Right  Result Date: 08/30/2019 CLINICAL DATA:  Pain after fall EXAM: RIGHT ANKLE - COMPLETE 3+ VIEW COMPARISON:  None. FINDINGS: A displaced distal fibular fracture is identified. And mildly displaced comminuted medial malleolar fracture is identified. A linear soft tissue calcification is seen anterior to the talar neck, likely displaced fragment from the ankle fractures. The talus itself appears intact. Punctate foci of high attenuation are projected in the soft tissues adjacent to the talus, age indeterminate. IMPRESSION: 1. Displaced fractures of the distal fibula and medial malleolus. The medial malleolar fracture is comminuted. A linear calcification in the soft tissues anterior to the talus is likely a displaced fracture fragment associated with the ankle fractures. Soft tissue swelling is identified. 2. Punctate foci of high attenuation in the soft tissues adjacent to the talus are consistent  with foreign bodies, age indeterminate. Electronically Signed   By: Gerome Sam III M.D   On: 08/30/2019 14:58   DG Chest Portable 1 View  Result Date: 08/30/2019 CLINICAL DATA:  Fall.  Difficulty breathing. EXAM: PORTABLE CHEST 1 VIEW COMPARISON:  July 26, 2012 FINDINGS: No pneumothorax. A left-sided rib fractures unchanged since 2013, nonacute. Cardiomediastinal silhouette is normal. Mild haziness over the right base is favored to be due to patient rotation. No suspicious infiltrates. A stent is seen in the descending thoracic aorta, new since 2013. No other acute abnormalities. IMPRESSION: No active disease. Electronically Signed   By: Gerome Sam III M.D   On: 08/30/2019 13:59    ____________________________________________   PROCEDURES  Procedure(s) performed (including Critical Care):  .Ortho  Injury Treatment  Date/Time: 08/30/2019 3:17 PM Performed by: Chinita Pester, FNP Authorized by: Chinita Pester, FNP   Consent:    Consent obtained:  Verbal   Consent given by:  Patient   Risks discussed:  StiffnessInjury location: ankle Location details: right ankle Injury type: fracture Fracture type: bimalleolar Pre-procedure neurovascular assessment: neurovascularly intact Manipulation performed: no Immobilization: splint Splint type: short leg and ankle stirrup Supplies used: cotton padding,  elastic bandage and Ortho-Glass Post-procedure neurovascular assessment: post-procedure neurovascularly intact     ____________________________________________   INITIAL IMPRESSION / ASSESSMENT AND PLAN     59 year old female presenting to the emergency department for treatment and evaluation of right lower extremity pain after mechanical, nonsyncopal fall at home prior to arrival.  See HPI for further details.  DIFFERENTIAL DIAGNOSIS  Pneumothorax, COPD exacerbation,  tibia and/or fibula fracture, metatarsal fracture  ED COURSE  Chest x-ray is negative for pneumothorax.   Image of the right ankle shows a comminuted fracture of the medial malleolus as well as a displaced fracture of the distal fibula.  OCL applied by ER tech.  Case was discussed with Dr. Martha Clan who will follow along in consult.  According to the discussion between the RN and the patient's daughter, the patient has had some URI symptoms over the past few days.  Daughter also reports that the patient's typical oxygen saturation is around 89%.  Today, the patient was 82% on room air when in triage.  She denies feeling any more short of breath than usual.  She denies chest pain  ----------------------------------------- 6:36 PM on 08/30/2019 -----------------------------------------  Oxygen saturation decreased to 77%. Oxygen tank was empty. Patient asymptomatic. Hospitalist consult for admission entered.    ----------------------------------------- 6:57 PM on 08/30/2019 -----------------------------------------  Oxygen saturation decreased to79%  with good pleth and matching heart rate while on 6 liters [place]. Will order CTA to rule out PE. Patient denies feeling short of breath.  ----------------------------------------- 7:30 PM on 08/30/2019 -----------------------------------------  Back from CT. Hospitalist at bedside. ____________________________________________   FINAL CLINICAL IMPRESSION(S) / ED DIAGNOSES  Final diagnoses:  COPD with acute exacerbation (HCC)  Hypoxia     ED Discharge Orders    None       Jamie Roman was evaluated in Emergency Department on 08/30/2019 for the symptoms described in the history of present illness. She was evaluated in the context of the global COVID-19 pandemic, which necessitated consideration that the patient might be at risk for infection with the SARS-CoV-2 virus that causes COVID-19. Institutional protocols and algorithms that pertain to the evaluation of patients at risk for COVID-19 are in a state of rapid change based on information released by regulatory bodies including the CDC and federal and state organizations.  These policies and algorithms were followed during the patient's care in the ED.  CRITICAL CARE Performed by: Sherrie George   Total critical care time: 30 minutes  Critical care time was exclusive of separately billable procedures and treating other patients.  Critical care was necessary to treat or prevent imminent or life-threatening deterioration.  Critical care was time spent personally by me on the following activities: development of treatment plan with patient and/or surrogate as well as nursing, discussions with consultants, evaluation of patient's response to treatment, examination of patient, obtaining history from patient or surrogate, ordering and performing treatments and interventions, ordering and  review of laboratory studies, ordering and review of radiographic studies, pulse oximetry and re-evaluation of patient's condition.  Note:  This document was prepared using Dragon voice recognition software and may include unintentional dictation errors.   Victorino Dike, FNP 08/30/19 1931    Drenda Freeze, MD 09/03/19 [phone]

## 2019-08-30 NOTE — H&P (Signed)
History and Physical    Jamie Roman:[id number] DOB: 1961/03/25 DOA: 08/30/2019  Referring MD/NP/PA:   PCP: [employer]   Patient coming from:  The patient is coming from home.  At baseline, pt is independent for most of ADL.        Chief Complaint: right ankle pain after fall at home  HPI: Jamie Roman is a 59 y.o. 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  Patient reports slipped and fell backwards on her porch yesterday. She reports right ankle pain and back pain. Did not seek medical attention right away. She reports not able to bear wear weight due to severe ankle pain, 9/10. She had cold symptoms for a week, with running nose and cough with clear or yellow sputum. She denies fever, chills, chest pain, SOB, abdominal pain, nausea, vomiting, diarrhea, dysuria, leg pain or swelling.  Per ED provider, daughter reports her baseline O2 at 89 to 90%. She should be on home O2 but not on. not compliant with following her pulmonologist at [facility].  ED Course: In triage, her O2 sat at 82%, now she requires 6 L O2 via [place].  She is afebrile, no tachycardia or tachypnea, BP 100s-110s/50s. CXR negative.  COVID-19 negative with 2 test methods negative.  Chest CTA for PE pending. R tibia-fibula and ankle x-ray showed displaced fractures of distal fibula and medial malleolus. Orthopedics Dr. Mack Guise saw pt. patient is admitted under hospitalist service due to hypoxia.  Review of Systems:   General: no fevers, chills, no body weight gain HEENT: no blurry vision, hearing changes or sore throat Respiratory: + cough CV: no chest pain, no palpitations GI: no nausea, vomiting, abdominal pain, diarrhea, constipation GU: no dysuria, burning on  urination, increased urinary frequency, hematuria  Ext: no leg edema Neuro: no unilateral weakness, numbness, or tingling, no vision change or hearing loss Skin: no rash, no skin tear. MSK: No muscle spasm, no deformity, no limitation of range of movement in spin Heme: No easy bruising.  Travel history: No recent long distant travel.  Allergy: No Known Allergies  Past Medical History:  Diagnosis Date  . AAA (abdominal aortic aneurysm) ([place])   . Bowel obstruction ([place])   . COPD (chronic obstructive pulmonary disease) ([place])   . Headache(784.0)   . Hypertension     Past Surgical History:  Procedure Laterality Date  . ABDOMINAL AORTIC ANEURYSM REPAIR    . COLOSTOMY    . COLOSTOMY REVERSAL      Social History:  reports that she has been smoking cigarettes. She has a 7.50 pack-year smoking history. She has never used smokeless tobacco. She reports current alcohol use. No history on file for drug.  Family History: History reviewed. No pertinent family history.   Prior to Admission medications   Medication Sig Start Date End Date Taking? Authorizing Provider  amLODipine (NORVASC) 10 MG tablet Take 1 tablet (10 mg total) by mouth daily. 01/05/12 08/30/19 Yes Viyuoh, Adeline C, MD  ASPIRIN LOW DOSE 81 MG EC tablet Take 81 mg by mouth daily. 08/10/19  Yes [provider]  atorvastatin (LIPITOR) 40 MG tablet Take 40 mg by mouth at bedtime. 08/10/19  Yes [provider]  carvedilol (COREG) 25 MG tablet Take 25 mg  by mouth 2 (two) times daily. 07/29/19  Yes [provider]  hydrochlorothiazide (MICROZIDE) 12.5 MG capsule Take 12.5 mg by mouth daily. 08/06/19  Yes [provider]  loratadine (CLARITIN) 10 MG tablet Take 10 mg by mouth daily. 06/13/19  Yes [provider]  losartan (COZAAR) 100 MG tablet Take 100 mg by mouth daily. 06/13/19  Yes [provider]  SYMBICORT 80-4.5 MCG/ACT inhaler Inhale 2 puffs into the lungs 2 (two) times daily.  08/21/19  Yes [provider]    Physical Exam: Vitals:   08/30/19 2222 08/30/19 2230 08/30/19 2300 08/30/19 2341  BP: 120/61 112/62 127/61 120/62  Pulse: 73 70 69 77  Resp: 18  16 16   Temp:      TempSrc:      SpO2: (!) 84% (!) 87% (!) 89% 92%  Weight:      Height:       General: Not in acute distress HEENT:       Eyes: PERRL, EOMI, no scleral icterus.       ENT: No discharge from the ears and nose, no pharynx injection, no tonsillar enlargement.        Neck: No JVD, no bruit, no mass felt. Heme: No neck lymph node enlargement. Cardiac: S1/S2, RRR, No murmurs, No gallops or rubs. Respiratory: Expiratory wheezes noted. No rales, or rhonchi or rubs. GI: Soft, nondistended, nontender, no rebound pain, no organomegaly, BS present. GU: No hematuria Ext: No pitting leg edema bilaterally.  Musculoskeletal: right lower leg and ankle in splint Skin: No rashes.  Neuro: Alert, oriented X3, cranial nerves II-XII grossly intact, moves all extremities normally. Muscle strength 5/5 in all extremities, sensation to light touch intact.  Psych: Patient is not psychotic, no suicidal or hemocidal ideation.  Labs on Admission: I have personally reviewed following labs and imaging studies  CBC: Recent Labs  Lab 08/30/19 1649  WBC 9.3  NEUTROABS 5.7  HGB 12.3  HCT 39.0  MCV 82.8  PLT 215   Basic Metabolic Panel: Recent Labs  Lab 08/30/19 1649  NA 135  K 3.6  CL 98  CO2 26  GLUCOSE 76  BUN 33*  CREATININE 1.42*  CALCIUM 8.6*   GFR: Estimated Creatinine Clearance: 38.1 mL/min (A) (by C-G formula based on SCr of 1.42 mg/dL (H)). Liver Function Tests: Recent Labs  Lab 08/30/19 1649  AST 31  ALT 15  ALKPHOS 87  BILITOT 0.9  PROT 6.9  ALBUMIN 3.1*   No results for input(s): LIPASE, AMYLASE in the last 168 hours. No results for input(s): AMMONIA in the last 168 hours. Coagulation Profile: Recent Labs  Lab 08/30/19 1649  INR 1.1   Cardiac Enzymes: No results  for input(s): CKTOTAL, CKMB, CKMBINDEX, TROPONINI in the last 168 hours. BNP (last 3 results) No results for input(s): PROBNP in the last 8760 hours. HbA1C: No results for input(s): HGBA1C in the last 72 hours. CBG: No results for input(s): GLUCAP in the last 168 hours. Lipid Profile: No results for input(s): CHOL, HDL, LDLCALC, TRIG, CHOLHDL, LDLDIRECT in the last 72 hours. Thyroid Function Tests: No results for input(s): TSH, T4TOTAL, FREET4, T3FREE, THYROIDAB in the last 72 hours. Anemia Panel: No results for input(s): VITAMINB12, FOLATE, FERRITIN, TIBC, IRON, RETICCTPCT in the last 72 hours. Urine analysis:    Component Value Date/Time   COLORURINE Amber 07/25/2012 1408   COLORURINE YELLOW 01/05/2012 0135   APPEARANCEUR Cloudy 07/25/2012 1408   LABSPEC 1.043 07/25/2012 1408   PHURINE 8.0 07/25/2012 1408  PHURINE 5.5 01/05/2012 0135   GLUCOSEU 150 mg/dL 38/04/1750 0258   HGBUR Negative 07/25/2012 1408   HGBUR NEGATIVE 01/05/2012 0135   BILIRUBINUR Negative 07/25/2012 1408   KETONESUR Negative 07/25/2012 1408   KETONESUR NEGATIVE 01/05/2012 0135   PROTEINUR >=500 07/25/2012 1408   PROTEINUR NEGATIVE 01/05/2012 0135   UROBILINOGEN 0.2 01/05/2012 0135   NITRITE Negative 07/25/2012 1408   NITRITE NEGATIVE 01/05/2012 0135   LEUKOCYTESUR Negative 07/25/2012 1408   Sepsis Labs: @LABRCNTIP (procalcitonin:4,lacticidven:4) ) Recent Results (from the past 240 hour(s))  SARS CORONAVIRUS 2 (TAT 6-24 HRS)     Status: None   Collection Time: 08/30/19  3:47 PM  Result Value Ref Range Status   SARS Coronavirus 2 NEGATIVE NEGATIVE Final    Comment: (NOTE) SARS-CoV-2 target nucleic acids are NOT DETECTED. The SARS-CoV-2 RNA is generally detectable in upper and lower respiratory specimens during the acute phase of infection. Negative results do not preclude SARS-CoV-2 infection, do not rule out co-infections with other pathogens, and should not be used as the sole basis for treatment  or other patient management decisions. Negative results must be combined with clinical observations, patient history, and epidemiological information. The expected result is Negative. Fact Sheet for Patients: 09/01/19 Fact Sheet for Healthcare Providers: HairSlick.no This test is not yet approved or cleared by the quierodirigir.com FDA and  has been authorized for detection and/or diagnosis of SARS-CoV-2 by FDA under an Emergency Use Authorization (EUA). This EUA will remain  in effect (meaning this test can be used) for the duration of the COVID-19 declaration under Section 56 4(b)(1) of the Act, 21 U.S.C. section 360bbb-3(b)(1), unless the authorization is terminated or revoked sooner. Performed at [facility], [address], [place], [place]      Radiological Exams on Admission: DG Tibia/Fibula Right  Result Date: 08/30/2019 CLINICAL DATA:  Pain after fall. Deformity right ankle. EXAM: RIGHT TIBIA AND FIBULA - 2 VIEW COMPARISON:  None. FINDINGS: Ankle fractures are identified. Please see the ankle films for full description. The remainder of the tibia and fibular intact. IMPRESSION: Ankle fractures are identified. These will be described on the dedicated ankle films. The remainder of the tibia and fibular intact. The distal femur is intact. Electronically Signed   By: 09/01/2019 III M.D   On: 08/30/2019 14:56   DG Ankle Complete Right  Result Date: 08/30/2019 CLINICAL DATA:  Pain after fall EXAM: RIGHT ANKLE - COMPLETE 3+ VIEW COMPARISON:  None. FINDINGS: A displaced distal fibular fracture is identified. And mildly displaced comminuted medial malleolar fracture is identified. A linear soft tissue calcification is seen anterior to the talar neck, likely displaced fragment from the ankle fractures. The talus itself appears intact. Punctate foci of high attenuation are projected in the soft tissues adjacent  to the talus, age indeterminate. IMPRESSION: 1. Displaced fractures of the distal fibula and medial malleolus. The medial malleolar fracture is comminuted. A linear calcification in the soft tissues anterior to the talus is likely a displaced fracture fragment associated with the ankle fractures. Soft tissue swelling is identified. 2. Punctate foci of high attenuation in the soft tissues adjacent to the talus are consistent with foreign bodies, age indeterminate. Electronically Signed   By: 09/01/2019 III M.D   On: 08/30/2019 14:58   CT Angio Chest PE W and/or Wo Contrast  Result Date: 08/30/2019 CLINICAL DATA:  Shortness of breath chest pain. EXAM: CT ANGIOGRAPHY CHEST WITH CONTRAST TECHNIQUE: Multidetector CT imaging of the chest was  performed using the standard protocol during bolus administration of intravenous contrast. Multiplanar CT image reconstructions and MIPs were obtained to evaluate the vascular anatomy. CONTRAST:  75mL OMNIPAQUE IOHEXOL 350 MG/ML SOLN COMPARISON:  None. FINDINGS: Cardiovascular: Contrast injection is sufficient to demonstrate satisfactory opacification of the pulmonary arteries to the segmental level. There is no pulmonary embolus. The main pulmonary artery is significantly dilated measuring approximately 3.9 cm in diameter. There is no CT evidence of acute right heart strain. The patient is status post aortic stent graft placement involving the descending thoracic aorta. The stent graft appears to be grossly patent. At the level of the distal descending thoracic aorta, there is a questionable filling defect (axial series 5, image 235). There is a partially thrombosed false lumen with the aorta at this level measuring up to approximately 5.3 cm in diameter on the coronal series (series 7, image 49). Heart size is enlarged. Coronary artery calcifications are noted. Thoracic aortic calcifications are noted. Mediastinum/Nodes: --No mediastinal or hilar lymphadenopathy. There are  few slightly prominent distal paraesophageal lymph nodes unknown clinical significance. --No axillary lymphadenopathy. --No supraclavicular lymphadenopathy. --Normal thyroid gland. --The esophagus is unremarkable Lungs/Pleura: Emphysematous changes are noted bilaterally. There is atelectasis at the right lung base. There is a small amount of atelectasis at the left lung base. There is complete collapse of the right middle lobe. There is a probable filling defect within the proximal right middle lobe bronchus. The trachea is otherwise unremarkable. There is some mild bilateral bronchial wall thickening and mucus plugging at the lung bases. Upper Abdomen: No acute abnormality. Musculoskeletal: No chest wall abnormality. No acute or significant osseous findings. Review of the MIP images confirms the above findings. IMPRESSION: 1. No evidence for an acute pulmonary embolism. 2. The patient is status post prior placement of a thoracic aortic endograft, perhaps related to a prior traumatic injury or dissection. The graft is grossly patent. 3. Intraluminal filling defect at the level of the distal descending thoracic aorta. This is felt to represent mixing artifact from the adjacent partially thrombosed false lumen. Intraluminal thrombus is difficult to entirely exclude on this study secondary to contrast timing. 4. Aneurysmal descending thoracic aorta measuring up to approximately 5.3 cm. 5. Complete collapse of the right middle lobe. There is a probable filling defect within the proximal right middle lobe bronchus (favored to represent secretions or debris). Consider endobronchial evaluation as clinically indicated. 6. There is some mucous plugging and bronchial wall thickening at the lung bases bilaterally suggestive of reactive or infectious bronchiolitis. 7. Dilated main pulmonary artery which can be seen in patients with elevated pulmonary artery pressures. 8. Cardiomegaly. 9. Aortic Atherosclerosis (ICD10-I70.0) and  Emphysema (ICD10-J43.9). Electronically Signed   By: Katherine Mantlehristopher  Green M.D.   On: 08/30/2019 19:33   DG Chest Portable 1 View  Result Date: 08/30/2019 CLINICAL DATA:  Fall.  Difficulty breathing. EXAM: PORTABLE CHEST 1 VIEW COMPARISON:  July 26, 2012 FINDINGS: No pneumothorax. A left-sided rib fractures unchanged since 2013, nonacute. Cardiomediastinal silhouette is normal. Mild haziness over the right base is favored to be due to patient rotation. No suspicious infiltrates. A stent is seen in the descending thoracic aorta, new since 2013. No other acute abnormalities. IMPRESSION: No active disease. Electronically Signed   By: Gerome Samavid  Williams III M.D   On: 08/30/2019 13:59    Assessment/Plan Principal Problem:   Bimalleolar ankle fracture, right, closed, initial encounter Active Problems:   HTN (hypertension), benign   COPD with acute exacerbation (HCC)   Acute  respiratory failure with hypoxia (HCC)   Aneurysm of thoracic aorta (HCC)  Jamie Roman is a 59 y.o. 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  Currently on 6 L still O2 sat to mid 80s at times, will place on BiPAP Duonebs scheduled and Q2h prn SOB Incentive spirometry  Admit to stepdown Antibiotics Doxycycline and  Rocephin, and probiotics Steroids: IV Solu-Medrol 80mg  given in ED, will c/w prednision.  Cough medicine Smoking cessation was advised. Pulmonary consult to endobronchial evaluation for RML collapse  descending thoracic aorta aneurysm measuring up to approximately 5.3 cm per CTA F/u outpatient  HTN, HLD Continue with amlodipine, carvedilol, losartan and hydralazine C/w Lipitor Hold aspirin for surgery  DVT ppx: SQ Heparin, need to hold before surgery Code Status: Full code Family Communication: None at bed side.               Disposition Plan:  Anticipate discharge back to previous home environment Consults called:   Admission status: SDU/inpation       Date of Service 08/30/2019   09/01/2019 [facility]  If 7PM-7AM, please contact night-coverage [email or web address] Password [facility] 08/31/2019, 12:15 AM

## 2019-08-30 NOTE — ED Notes (Signed)
Attempted to call report to floor, they will call back 

## 2019-08-30 NOTE — ED Notes (Signed)
Pt transported to xray 

## 2019-08-30 NOTE — ED Notes (Signed)
Loralyn Freshwater, FNP aware of pt O2 demand and sat readings.  Pt is resting comfortably on ER stretcher, denies SHOB, increased respiratory effort or other new symptoms.  Pt remains on continuous pulse ox, will monitor for change in status.

## 2019-08-31 DIAGNOSIS — J441 Chronic obstructive pulmonary disease with (acute) exacerbation: Secondary | ICD-10-CM

## 2019-08-31 DIAGNOSIS — I712 Thoracic aortic aneurysm, without rupture, unspecified: Secondary | ICD-10-CM | POA: Diagnosis present

## 2019-08-31 DIAGNOSIS — J9601 Acute respiratory failure with hypoxia: Secondary | ICD-10-CM

## 2019-08-31 DIAGNOSIS — J9811 Atelectasis: Secondary | ICD-10-CM

## 2019-08-31 DIAGNOSIS — I10 Essential (primary) hypertension: Secondary | ICD-10-CM

## 2019-08-31 LAB — BASIC METABOLIC PANEL
Anion gap: 6 (ref 5–15)
BUN: 36 mg/dL — ABNORMAL HIGH (ref 6–20)
CO2: 29 mmol/L (ref 22–32)
Calcium: 8.5 mg/dL — ABNORMAL LOW (ref 8.9–10.3)
Chloride: 98 mmol/L (ref 98–111)
Creatinine, Ser: 1.28 mg/dL — ABNORMAL HIGH (ref 0.44–1.00)
GFR calc Af Amer: 53 mL/min — ABNORMAL LOW (ref >60)
GFR calc non Af Amer: 45 mL/min — ABNORMAL LOW (ref >60)
Glucose, Bld: 142 mg/dL — ABNORMAL HIGH (ref 70–99)
Potassium: 4.1 mmol/L (ref 3.5–5.1)
Sodium: 133 mmol/L — ABNORMAL LOW (ref 135–145)

## 2019-08-31 LAB — MRSA PCR SCREENING: MRSA by PCR: NEGATIVE

## 2019-08-31 LAB — CBC
HCT: 41.2 % (ref 36.0–46.0)
Hemoglobin: 12.8 g/dL (ref 12.0–15.0)
MCH: 25.9 pg — ABNORMAL LOW (ref 26.0–34.0)
MCHC: 31.1 g/dL (ref 30.0–36.0)
MCV: 83.4 fL (ref 80.0–100.0)
Platelets: 209 10*3/uL (ref 150–400)
RBC: 4.94 MIL/uL (ref 3.87–5.11)
RDW: 16.3 % — ABNORMAL HIGH (ref 11.5–15.5)
WBC: 5 10*3/uL (ref 4.0–10.5)
nRBC: 0 % (ref 0.0–0.2)

## 2019-08-31 LAB — TYPE AND SCREEN
ABO/RH(D): O POS
Antibody Screen: NEGATIVE

## 2019-08-31 LAB — HIV ANTIBODY (ROUTINE TESTING W REFLEX): HIV Screen 4th Generation wRfx: NONREACTIVE

## 2019-08-31 LAB — GLUCOSE, CAPILLARY: Glucose-Capillary: 146 mg/dL — ABNORMAL HIGH (ref 70–99)

## 2019-08-31 LAB — PROTIME-INR
INR: 1 (ref 0.8–1.2)
Prothrombin Time: 13.3 seconds (ref 11.4–15.2)

## 2019-08-31 LAB — ABO/RH: ABO/RH(D): O POS

## 2019-08-31 MED ORDER — LIDOCAINE HCL (PF) 2 % IJ SOLN
INTRAMUSCULAR | Status: AC
  Filled 2019-08-31: qty 10

## 2019-08-31 MED ORDER — HYDROCHLOROTHIAZIDE 25 MG PO TABS
12.5000 mg | ORAL_TABLET | Freq: Every day | ORAL | Status: DC
  Administered 2019-08-31 – 2019-09-04 (×4): 12.5 mg via ORAL
  Filled 2019-08-31 (×4): qty 1

## 2019-08-31 MED ORDER — ALBUTEROL SULFATE (2.5 MG/3ML) 0.083% IN NEBU
2.5000 mg | INHALATION_SOLUTION | RESPIRATORY_TRACT | Status: DC
  Administered 2019-08-31 (×3): 2.5 mg via RESPIRATORY_TRACT
  Filled 2019-08-31 (×3): qty 3

## 2019-08-31 MED ORDER — SODIUM CHLORIDE 0.9 % IV SOLN
INTRAVENOUS | Status: DC | PRN
  Administered 2019-08-31 – 2019-09-02 (×2): 1000 mL via INTRAVENOUS
  Administered 2019-09-03: 250 mL via INTRAVENOUS

## 2019-08-31 MED ORDER — HYDROCHLOROTHIAZIDE 12.5 MG PO CAPS
12.5000 mg | ORAL_CAPSULE | Freq: Every day | ORAL | Status: DC
  Filled 2019-08-31: qty 1

## 2019-08-31 MED ORDER — LOSARTAN POTASSIUM 50 MG PO TABS
100.0000 mg | ORAL_TABLET | Freq: Every day | ORAL | Status: DC
  Administered 2019-08-31 – 2019-09-04 (×4): 100 mg via ORAL
  Filled 2019-08-31 (×4): qty 2

## 2019-08-31 MED ORDER — PREDNISOLONE 5 MG PO TABS: 40.0000 mg | ORAL_TABLET | Freq: Every day | ORAL | Status: DC

## 2019-08-31 MED ORDER — PREDNISONE 20 MG PO TABS
40.0000 mg | ORAL_TABLET | Freq: Every day | ORAL | Status: DC
  Administered 2019-08-31 – 2019-09-04 (×4): 40 mg via ORAL
  Filled 2019-08-31: qty 2
  Filled 2019-08-31: qty 4
  Filled 2019-08-31 (×2): qty 2

## 2019-08-31 MED ORDER — LORATADINE 10 MG PO TABS
10.0000 mg | ORAL_TABLET | Freq: Every day | ORAL | Status: DC
  Administered 2019-08-31 – 2019-09-04 (×4): 10 mg via ORAL
  Filled 2019-08-31 (×4): qty 1

## 2019-08-31 MED ORDER — CHLORHEXIDINE GLUCONATE CLOTH 2 % EX PADS
6.0000 | MEDICATED_PAD | Freq: Every day | CUTANEOUS | Status: DC
  Administered 2019-08-31 – 2019-09-04 (×3): 6 via TOPICAL

## 2019-08-31 MED ORDER — ATORVASTATIN CALCIUM 20 MG PO TABS
40.0000 mg | ORAL_TABLET | Freq: Every day | ORAL | Status: DC
  Administered 2019-08-31 – 2019-09-03 (×4): 40 mg via ORAL
  Filled 2019-08-31 (×4): qty 2

## 2019-08-31 MED ORDER — MOMETASONE FURO-FORMOTEROL FUM 100-5 MCG/ACT IN AERO
2.0000 | INHALATION_SPRAY | Freq: Two times a day (BID) | RESPIRATORY_TRACT | Status: DC
  Administered 2019-08-31 – 2019-09-04 (×8): 2 via RESPIRATORY_TRACT
  Filled 2019-08-31: qty 8.8

## 2019-08-31 MED ORDER — PROPOFOL 10 MG/ML IV BOLUS
INTRAVENOUS | Status: AC
  Filled 2019-08-31: qty 20

## 2019-08-31 MED ORDER — ACETYLCYSTEINE 20 % IN SOLN
2.0000 mL | Freq: Four times a day (QID) | RESPIRATORY_TRACT | Status: DC
  Administered 2019-09-01 – 2019-09-02 (×2): 2 mL via RESPIRATORY_TRACT
  Filled 2019-08-31: qty 4
  Filled 2019-08-31 (×2): qty 2
  Filled 2019-08-31 (×2): qty 4
  Filled 2019-08-31: qty 2
  Filled 2019-08-31: qty 4
  Filled 2019-08-31 (×2): qty 2
  Filled 2019-08-31: qty 4
  Filled 2019-08-31: qty 2
  Filled 2019-08-31: qty 4

## 2019-08-31 MED ORDER — IPRATROPIUM-ALBUTEROL 0.5-2.5 (3) MG/3ML IN SOLN
3.0000 mL | RESPIRATORY_TRACT | Status: DC
  Administered 2019-08-31 – 2019-09-02 (×8): 3 mL via RESPIRATORY_TRACT
  Filled 2019-08-31 (×8): qty 3

## 2019-08-31 MED ORDER — BLISTEX MEDICATED EX OINT
TOPICAL_OINTMENT | CUTANEOUS | Status: DC | PRN
  Filled 2019-08-31: qty 6.3

## 2019-08-31 MED ORDER — AMLODIPINE BESYLATE 10 MG PO TABS
10.0000 mg | ORAL_TABLET | Freq: Every day | ORAL | Status: DC
  Administered 2019-08-31 – 2019-09-04 (×4): 10 mg via ORAL
  Filled 2019-08-31 (×4): qty 1

## 2019-08-31 MED ORDER — CARVEDILOL 25 MG PO TABS
25.0000 mg | ORAL_TABLET | Freq: Two times a day (BID) | ORAL | Status: DC
  Administered 2019-08-31 – 2019-09-04 (×8): 25 mg via ORAL
  Filled 2019-08-31: qty 2
  Filled 2019-08-31 (×7): qty 1

## 2019-08-31 MED ORDER — ALBUTEROL SULFATE (2.5 MG/3ML) 0.083% IN NEBU: 2.5000 mg | INHALATION_SOLUTION | Freq: Four times a day (QID) | RESPIRATORY_TRACT | Status: DC

## 2019-08-31 NOTE — Progress Notes (Signed)
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  She presented to the ED on 1/31 after mechanical fall at home and acute onset of right ankle pain without ability to bear weight.  In the ED, patient was found to be hypoxic with O2 sat of 82 %, improved on 6 L/min oxygen by nasal cannula.  Patient's daughter reported that she is supposed be on home oxygen but does not have it.  Apparently noncompliant with pulmonology follow-up.  Other vitals were stable.  Chest x-ray was negative, COVID-19 was negative.  CTA chest ruled out PE.  She was found to have tib-fib fracture of the right ankle on x-ray.  Orthopedics was consulted and patient admitted to hospitalist service due to hypoxia.  Patient was subsequently on BiPAP.  Has since been weaned off and is down to 2 L/min oxygen by nasal cannula as of this morning.  CTA chest did show right middle lobe collapse likely due to mucous plugging, as well as bronchial wall thickening at the bilateral bases.  Pulmonary was consulted for possible bronchoscopy.  However, given patient's improvement in respiratory status, this may not be necessary.  Subjective 2/1: Patient seen this morning at bedside in stepdown.  No acute events reported overnight.  She reports 7 out of 10 pain in the right ankle.  Says medication improves it to about a 5 which is fairly tolerable.  States she has had a cold recently and had been needing to use her inhalers more frequently than normal.  Reports productive cough.  States is her birthday tomorrow and she is upset to be stuck in the hospital.  She denies fevers or chills, nausea vomiting or  diarrhea, chest pain or other acute complaints.  Assessment & Plan:   Principal Problem:   Bimalleolar ankle fracture, right, closed, initial encounter Active Problems:   HTN (hypertension), benign   COPD with acute exacerbation ([place])   Acute respiratory failure with hypoxia ([facility])   Aneurysm of thoracic aorta ([facility])   Bimalleolar ankle fracture, right, closed, initial encounter Secondary to mechanical fall.  --Ortho following, plan for surgery tomorrow, 2/2 --Expect she will be medically stable for surgery given drastic improvement in her hypoxia --Nonweightbearing on right lower extremity --Pain control --Bowel regimen --PT eval postop  Acute respiratory failure with hypoxia secondary to Acute exacerbation of COPD  Patient was initially requiring 6 L/min, then placed on BiPAP, has since been weaned to 2 L/min by nasal cannula.  Had upper respiratory illness recently and increased COPD symptoms requiring increased use of rescue inhaler. --Supplemental oxygen, maintain O2 sat greater than 88% --DuoNebs scheduled --Albuterol every 2 hours as needed --Incentive spirometry and flutter valve --Continue Rocephin and doxy --Continue prednisone --Antitussives  --Strongly advised smoking cessation --Pulmonary consulted due to right middle lobe collapse seen on CTA chest  Descending thoracic aortic aneurysm -chronic, status post stent Measured 5.3 cm on CTA chest upon admission.  Follow-up outpatient.  Essential hypertension -continue home amlodipine, Coreg, losartan, hydralazine Hyperlipidemia -continue Lipitor and hold aspirin for surgery   DVT prophylaxis: Heparin, hold before surgery   Code Status: Full Code  Family Communication: None at bedside Disposition Plan: Pending  surgery and PT eval postoperatively.     Consultants:   Orthopedics  Pulmonology  Procedures:   None  Antimicrobials:   None   Objective: Vitals:   08/31/19 1244 08/31/19 1300 08/31/19 1400  08/31/19 1648  BP:  108/75 135/63 135/63  Pulse:  70 68 72  Resp:  15 16   Temp:    98.5 F (36.9 C)  TempSrc:    Oral  SpO2: 98% 96% 98% 100%  Weight:      Height:        Intake/Output Summary (Last 24 hours) at 08/31/2019 1842 Last data filed at 08/31/2019 1456 Gross per 24 hour  Intake 343 ml  Output 725 ml  Net -382 ml   Filed Weights   08/30/19 1309 08/31/19 0000  Weight: 68 kg 72.7 kg    Examination:  General exam: awake, alert, no acute distress HEENT: moist mucus membranes, hearing grossly normal  Respiratory system: Poor air movement in expiratory wheezing, normal respiratory effort on 2 L/min nasal cannula oxygen. Cardiovascular system: normal S1/S2, RRR, no JVD, murmurs, rubs, gallops, no pedal edema.   Gastrointestinal system: soft, non-tender, non-distended abdomen Central nervous system: alert and oriented x4. no gross focal neurologic deficits, normal speech Extremities: Right lower extremity dressing intact, distal sensation intact, normal tone Skin: dry, intact, normal temperature Psychiatry: normal mood, congruent affect, judgement and insight appear normal    Data Reviewed: I have personally reviewed following labs and imaging studies  CBC: Recent Labs  Lab 08/30/19 1649 08/31/19 0419  WBC 9.3 5.0  NEUTROABS 5.7  --   HGB 12.3 12.8  HCT 39.0 41.2  MCV 82.8 83.4  PLT 215 209   Basic Metabolic Panel: Recent Labs  Lab 08/30/19 1649 08/31/19 0419  NA 135 133*  K 3.6 4.1  CL 98 98  CO2 26 29  GLUCOSE 76 142*  BUN 33* 36*  CREATININE 1.42* 1.28*  CALCIUM 8.6* 8.5*   GFR: Estimated Creatinine Clearance: 43.8 mL/min (A) (by C-G formula based on SCr of 1.28 mg/dL (H)). Liver Function Tests: Recent Labs  Lab 08/30/19 1649  AST 31  ALT 15  ALKPHOS 87  BILITOT 0.9  PROT 6.9  ALBUMIN 3.1*   No results for input(s): LIPASE, AMYLASE in the last 168 hours. No results for input(s): AMMONIA in the last 168 hours. Coagulation  Profile: Recent Labs  Lab 08/30/19 1649 08/31/19 0419  INR 1.1 1.0   Cardiac Enzymes: No results for input(s): CKTOTAL, CKMB, CKMBINDEX, TROPONINI in the last 168 hours. BNP (last 3 results) No results for input(s): PROBNP in the last 8760 hours. HbA1C: No results for input(s): HGBA1C in the last 72 hours. CBG: Recent Labs  Lab 08/31/19 0012  GLUCAP 146*   Lipid Profile: No results for input(s): CHOL, HDL, LDLCALC, TRIG, CHOLHDL, LDLDIRECT in the last 72 hours. Thyroid Function Tests: No results for input(s): TSH, T4TOTAL, FREET4, T3FREE, THYROIDAB in the last 72 hours. Anemia Panel: No results for input(s): VITAMINB12, FOLATE, FERRITIN, TIBC, IRON, RETICCTPCT in the last 72 hours. Sepsis Labs: No results for input(s): PROCALCITON, LATICACIDVEN in the last 168 hours.  Recent Results (from the past 240 hour(s))  SARS CORONAVIRUS 2 (TAT 6-24 HRS)     Status: None   Collection Time: 08/30/19  3:47 PM  Result Value Ref Range Status   SARS Coronavirus 2 NEGATIVE NEGATIVE Final    Comment: (NOTE) SARS-CoV-2 target nucleic acids are NOT DETECTED. The SARS-CoV-2 RNA is generally detectable in upper and lower  respiratory specimens during the acute phase of infection. Negative results do not preclude SARS-CoV-2 infection, do not rule out co-infections with other pathogens, and should not be used as the sole basis for treatment or other patient management decisions. Negative results must be combined with clinical observations, patient history, and epidemiological information. The expected result is Negative. Fact Sheet for Patients: HairSlick.no Fact Sheet for Healthcare Providers: quierodirigir.com This test is not yet approved or cleared by the [place] FDA and  has been authorized for detection and/or diagnosis of SARS-CoV-2 by FDA under an Emergency Use Authorization (EUA). This EUA will remain  in effect (meaning  this test can be used) for the duration of the COVID-19 declaration under Section 56 4(b)(1) of the Act, 21 U.S.C. section 360bbb-3(b)(1), unless the authorization is terminated or revoked sooner. Performed at [facility], [address] [place], [place] [address]   MRSA PCR Screening     Status: None   Collection Time: 08/31/19 12:16 AM   Specimen: Nasal Mucosa; Nasopharyngeal  Result Value Ref Range Status   MRSA by PCR NEGATIVE NEGATIVE Final    Comment:        The GeneXpert MRSA Assay (FDA approved for NASAL specimens only), is one component of a comprehensive MRSA colonization surveillance program. It is not intended to diagnose MRSA infection nor to guide or monitor treatment for MRSA infections. Performed at [facility], [address], [place], [place] [address]          Radiology Studies: DG Tibia/Fibula Right  Result Date: 08/30/2019 CLINICAL DATA:  Pain after fall. Deformity right ankle. EXAM: RIGHT TIBIA AND FIBULA - 2 VIEW COMPARISON:  None. FINDINGS: Ankle fractures are identified. Please see the ankle films for full description. The remainder of the tibia and fibular intact. IMPRESSION: Ankle fractures are identified. These will be described on the dedicated ankle films. The remainder of the tibia and fibular intact. The distal femur is intact. Electronically Signed   By: Gerome Sam III M.D   On: 08/30/2019 14:56   DG Ankle Complete Right  Result Date: 08/30/2019 CLINICAL DATA:  Pain after fall EXAM: RIGHT ANKLE - COMPLETE 3+ VIEW COMPARISON:  None. FINDINGS: A displaced distal fibular fracture is identified. And mildly displaced comminuted medial malleolar fracture is identified. A linear soft tissue calcification is seen anterior to the talar neck, likely displaced fragment from the ankle fractures. The talus itself appears intact. Punctate foci of high attenuation are projected in the soft tissues adjacent to the talus, age indeterminate.  IMPRESSION: 1. Displaced fractures of the distal fibula and medial malleolus. The medial malleolar fracture is comminuted. A linear calcification in the soft tissues anterior to the talus is likely a displaced fracture fragment associated with the ankle fractures. Soft tissue swelling is identified. 2. Punctate foci of high attenuation in the soft tissues adjacent to the talus are consistent with foreign bodies, age indeterminate. Electronically Signed   By: Gerome Sam III M.D   On: 08/30/2019 14:58   CT Angio Chest PE W and/or Wo Contrast  Result Date: 08/30/2019 CLINICAL DATA:  Shortness of breath chest pain. EXAM: CT ANGIOGRAPHY CHEST WITH CONTRAST TECHNIQUE: Multidetector CT imaging of the chest was performed using the standard protocol during bolus administration of intravenous contrast. Multiplanar CT image reconstructions and MIPs were obtained to evaluate the vascular anatomy. CONTRAST:  13mL OMNIPAQUE IOHEXOL 350 MG/ML SOLN COMPARISON:  None. FINDINGS: Cardiovascular: Contrast injection is sufficient to demonstrate satisfactory opacification of the  pulmonary arteries to the segmental level. There is no pulmonary embolus. The main pulmonary artery is significantly dilated measuring approximately 3.9 cm in diameter. There is no CT evidence of acute right heart strain. The patient is status post aortic stent graft placement involving the descending thoracic aorta. The stent graft appears to be grossly patent. At the level of the distal descending thoracic aorta, there is a questionable filling defect (axial series 5, image 235). There is a partially thrombosed false lumen with the aorta at this level measuring up to approximately 5.3 cm in diameter on the coronal series (series 7, image 49). Heart size is enlarged. Coronary artery calcifications are noted. Thoracic aortic calcifications are noted. Mediastinum/Nodes: --No mediastinal or hilar lymphadenopathy. There are few slightly prominent distal  paraesophageal lymph nodes unknown clinical significance. --No axillary lymphadenopathy. --No supraclavicular lymphadenopathy. --Normal thyroid gland. --The esophagus is unremarkable Lungs/Pleura: Emphysematous changes are noted bilaterally. There is atelectasis at the right lung base. There is a small amount of atelectasis at the left lung base. There is complete collapse of the right middle lobe. There is a probable filling defect within the proximal right middle lobe bronchus. The trachea is otherwise unremarkable. There is some mild bilateral bronchial wall thickening and mucus plugging at the lung bases. Upper Abdomen: No acute abnormality. Musculoskeletal: No chest wall abnormality. No acute or significant osseous findings. Review of the MIP images confirms the above findings. IMPRESSION: 1. No evidence for an acute pulmonary embolism. 2. The patient is status post prior placement of a thoracic aortic endograft, perhaps related to a prior traumatic injury or dissection. The graft is grossly patent. 3. Intraluminal filling defect at the level of the distal descending thoracic aorta. This is felt to represent mixing artifact from the adjacent partially thrombosed false lumen. Intraluminal thrombus is difficult to entirely exclude on this study secondary to contrast timing. 4. Aneurysmal descending thoracic aorta measuring up to approximately 5.3 cm. 5. Complete collapse of the right middle lobe. There is a probable filling defect within the proximal right middle lobe bronchus (favored to represent secretions or debris). Consider endobronchial evaluation as clinically indicated. 6. There is some mucous plugging and bronchial wall thickening at the lung bases bilaterally suggestive of reactive or infectious bronchiolitis. 7. Dilated main pulmonary artery which can be seen in patients with elevated pulmonary artery pressures. 8. Cardiomegaly. 9. Aortic Atherosclerosis (ICD10-I70.0) and Emphysema (ICD10-J43.9).  Electronically Signed   By: Katherine Mantlehristopher  Green M.D.   On: 08/30/2019 19:33   DG Chest Portable 1 View  Result Date: 08/30/2019 CLINICAL DATA:  Fall.  Difficulty breathing. EXAM: PORTABLE CHEST 1 VIEW COMPARISON:  July 26, 2012 FINDINGS: No pneumothorax. A left-sided rib fractures unchanged since 2013, nonacute. Cardiomediastinal silhouette is normal. Mild haziness over the right base is favored to be due to patient rotation. No suspicious infiltrates. A stent is seen in the descending thoracic aorta, new since 2013. No other acute abnormalities. IMPRESSION: No active disease. Electronically Signed   By: Gerome Samavid  Williams III M.D   On: 08/30/2019 13:59        Scheduled Meds: . acetylcysteine  2 mL Nebulization QID  . amLODipine  10 mg Oral Daily  . atorvastatin  40 mg Oral QHS  . carvedilol  25 mg Oral BID  . Chlorhexidine Gluconate Cloth  6 each Topical Daily  . docusate sodium  100 mg Oral BID  . doxycycline  100 mg Oral Q12H  . guaiFENesin  600 mg Oral BID  . heparin  5,000 Units Subcutaneous Q8H  . hydrochlorothiazide  12.5 mg Oral Daily  . ipratropium-albuterol  3 mL Nebulization Q4H  . lactobacillus acidophilus  2 tablet Oral BID  . loratadine  10 mg Oral Daily  . losartan  100 mg Oral Daily  . mometasone-formoterol  2 puff Inhalation BID  . predniSONE  40 mg Oral Q breakfast  . senna  1 tablet Oral BID  . sodium chloride flush  3 mL Intravenous Q12H   Continuous Infusions: .  ceFAZolin (ANCEF) IV    . cefTRIAXone (ROCEPHIN)  IV Stopped (08/30/19 2226)  . methocarbamol (ROBAXIN) IV       LOS: 1 day    Time spent: 40 to 45 minutes   Pennie Banter, DO [facility]   If 7PM-7AM, please contact night-coverage [email or web address] 08/31/2019, 6:42 PM

## 2019-08-31 NOTE — Consult Note (Signed)
Pulmonary Critical Care  Initial Consult Note  Jamie Roman UYQ:[id number] DOB: 02/25/1961 DOA: 08/30/2019  Referring physician: Dr. Arbutus Ped  Chief Complaint: Abnormal chest x-ray  HPI: Jamie Roman is a 59 y.o. female patient apparently presented to the hospital after a fall which caused injury to the ankle.  The patient at that time really did not have any specific complaints related to her lungs.  She denied having any shortness of breath chest pain she denied having any hemoptysis.  The daughter states however that she had been having a cough going on several weeks which they felt well has been just a cold.  She did have a chest x-ray done which was suspicious for possibility of atelectasis this was followed up by a CT scan of the chest which shows right middle lobe collapse and there is some concern whether that may be some debris inside the airway.  I reviewed the scan and I reviewed the results and I discussed the results with the patient as well as the patient's daughter.  Options include proceeding to bronchoscopy or conservative measures first followed by bronchoscopy.  I did also explained to them that the concern here it really is with her history of smoking that this could represent an endobronchial lesion and therefore should actually be evaluated by direct visualization with the bronchoscope.  The patient right now wants to opt for conservative measures to see if this is just mucus and it might clear in the lung reexpanded.  She denies having any prior history of lung cancer but she does admit to smoking but says that she started later in life  Review of Systems:  Constitutional:  No weight loss, night sweats, Fevers, chills, fatigue.  HEENT:  No headaches, nasal congestion, post nasal drip,  Cardio-vascular:  No chest pain, Orthopnea, PND, swelling in lower extremities, anasarca, dizziness, palpitations  GI:  No heartburn, indigestion, abdominal pain, nausea, vomiting,  diarrhea  Resp:  No shortness of breath with exertion or at rest. + cough, No coughing up of blood.No wheezing Skin:  no rash or lesions.  Musculoskeletal:  No joint pain or swelling.   Remainder ROS performed and is unremarkable other than noted in HPI  Past Medical History:  Diagnosis Date  . AAA (abdominal aortic aneurysm) ([place])   . Bowel obstruction ([place])   . COPD (chronic obstructive pulmonary disease) ([place])   . Headache(784.0)   . Hypertension    Past Surgical History:  Procedure Laterality Date  . ABDOMINAL AORTIC ANEURYSM REPAIR    . COLOSTOMY    . COLOSTOMY REVERSAL     Social History:  reports that she has been smoking cigarettes. She has a 7.50 pack-year smoking history. She has never used smokeless tobacco. She reports current alcohol use. No history on file for drug.  No Known Allergies  History reviewed. No pertinent family history.  Prior to Admission medications   Medication Sig Start Date End Date Taking? Authorizing Provider  amLODipine (NORVASC) 10 MG tablet Take 1 tablet (10 mg total) by mouth daily. 01/05/12 08/30/19 Yes Viyuoh, Adeline C, MD  ASPIRIN LOW DOSE 81 MG EC tablet Take 81 mg by mouth daily. 08/10/19  Yes [provider]  atorvastatin (LIPITOR) 40 MG tablet Take 40 mg by mouth at bedtime. 08/10/19  Yes [provider]  carvedilol (COREG) 25 MG tablet Take 25 mg by mouth 2 (two) times daily. 07/29/19  Yes [provider]  hydrochlorothiazide (MICROZIDE) 12.5 MG capsule Take 12.5 mg by  mouth daily. 08/06/19  Yes [provider]  loratadine (CLARITIN) 10 MG tablet Take 10 mg by mouth daily. 06/13/19  Yes [provider]  losartan (COZAAR) 100 MG tablet Take 100 mg by mouth daily. 06/13/19  Yes [provider]  SYMBICORT 80-4.5 MCG/ACT inhaler Inhale 2 puffs into the lungs 2 (two) times daily. 08/21/19  Yes [provider]   Physical Exam: Vitals:   08/31/19 1100 08/31/19 1244 08/31/19 1300  08/31/19 1400  BP: 101/78  108/75 135/63  Pulse: 65  70 68  Resp: 15  15 16   Temp:      TempSrc:      SpO2: 91% 98% 96% 98%  Weight:      Height:        Wt Readings from Last 3 Encounters:  08/31/19 72.7 kg  01/04/12 71.1 kg    General:  Appears calm and comfortable Eyes: PERRL, normal lids, irises & conjunctiva ENT: grossly normal hearing, lips & tongue Neck: no LAD, masses or thyromegaly Cardiovascular: RRR, no m/r/g. No LE edema. Respiratory: CTA bilaterally, no w/r/r.       Normal respiratory effort.  Few scattered basilar crackles noted Abdomen: soft, nontender Skin: no rash or induration seen on limited exam Musculoskeletal: grossly normal tone BUE/BLE Psychiatric: grossly normal mood and affect Neurologic: grossly non-focal.          Labs on Admission:  Basic Metabolic Panel: Recent Labs  Lab 08/30/19 1649 08/31/19 0419  NA 135 133*  K 3.6 4.1  CL 98 98  CO2 26 29  GLUCOSE 76 142*  BUN 33* 36*  CREATININE 1.42* 1.28*  CALCIUM 8.6* 8.5*   Liver Function Tests: Recent Labs  Lab 08/30/19 1649  AST 31  ALT 15  ALKPHOS 87  BILITOT 0.9  PROT 6.9  ALBUMIN 3.1*   No results for input(s): LIPASE, AMYLASE in the last 168 hours. No results for input(s): AMMONIA in the last 168 hours. CBC: Recent Labs  Lab 08/30/19 1649 08/31/19 0419  WBC 9.3 5.0  NEUTROABS 5.7  --   HGB 12.3 12.8  HCT 39.0 41.2  MCV 82.8 83.4  PLT 215 209   Cardiac Enzymes: No results for input(s): CKTOTAL, CKMB, CKMBINDEX, TROPONINI in the last 168 hours.  BNP (last 3 results) Recent Labs    08/30/19 1649  BNP 75.0    ProBNP (last 3 results) No results for input(s): PROBNP in the last 8760 hours.  CBG: Recent Labs  Lab 08/31/19 0012  GLUCAP 146*    Radiological Exams on Admission: DG Tibia/Fibula Right  Result Date: 08/30/2019 CLINICAL DATA:  Pain after fall. Deformity right ankle. EXAM: RIGHT TIBIA AND FIBULA - 2 VIEW COMPARISON:  None. FINDINGS: Ankle  fractures are identified. Please see the ankle films for full description. The remainder of the tibia and fibular intact. IMPRESSION: Ankle fractures are identified. These will be described on the dedicated ankle films. The remainder of the tibia and fibular intact. The distal femur is intact. Electronically Signed   By: 09/01/2019 III M.D   On: 08/30/2019 14:56   DG Ankle Complete Right  Result Date: 08/30/2019 CLINICAL DATA:  Pain after fall EXAM: RIGHT ANKLE - COMPLETE 3+ VIEW COMPARISON:  None. FINDINGS: A displaced distal fibular fracture is identified. And mildly displaced comminuted medial malleolar fracture is identified. A linear soft tissue calcification is seen anterior to the talar neck, likely displaced fragment from the ankle fractures. The talus itself appears intact. Punctate foci of high  attenuation are projected in the soft tissues adjacent to the talus, age indeterminate. IMPRESSION: 1. Displaced fractures of the distal fibula and medial malleolus. The medial malleolar fracture is comminuted. A linear calcification in the soft tissues anterior to the talus is likely a displaced fracture fragment associated with the ankle fractures. Soft tissue swelling is identified. 2. Punctate foci of high attenuation in the soft tissues adjacent to the talus are consistent with foreign bodies, age indeterminate. Electronically Signed   By: Gerome Sam III M.D   On: 08/30/2019 14:58   CT Angio Chest PE W and/or Wo Contrast  Result Date: 08/30/2019 CLINICAL DATA:  Shortness of breath chest pain. EXAM: CT ANGIOGRAPHY CHEST WITH CONTRAST TECHNIQUE: Multidetector CT imaging of the chest was performed using the standard protocol during bolus administration of intravenous contrast. Multiplanar CT image reconstructions and MIPs were obtained to evaluate the vascular anatomy. CONTRAST:  70mL OMNIPAQUE IOHEXOL 350 MG/ML SOLN COMPARISON:  None. FINDINGS: Cardiovascular: Contrast injection is sufficient to  demonstrate satisfactory opacification of the pulmonary arteries to the segmental level. There is no pulmonary embolus. The main pulmonary artery is significantly dilated measuring approximately 3.9 cm in diameter. There is no CT evidence of acute right heart strain. The patient is status post aortic stent graft placement involving the descending thoracic aorta. The stent graft appears to be grossly patent. At the level of the distal descending thoracic aorta, there is a questionable filling defect (axial series 5, image 235). There is a partially thrombosed false lumen with the aorta at this level measuring up to approximately 5.3 cm in diameter on the coronal series (series 7, image 49). Heart size is enlarged. Coronary artery calcifications are noted. Thoracic aortic calcifications are noted. Mediastinum/Nodes: --No mediastinal or hilar lymphadenopathy. There are few slightly prominent distal paraesophageal lymph nodes unknown clinical significance. --No axillary lymphadenopathy. --No supraclavicular lymphadenopathy. --Normal thyroid gland. --The esophagus is unremarkable Lungs/Pleura: Emphysematous changes are noted bilaterally. There is atelectasis at the right lung base. There is a small amount of atelectasis at the left lung base. There is complete collapse of the right middle lobe. There is a probable filling defect within the proximal right middle lobe bronchus. The trachea is otherwise unremarkable. There is some mild bilateral bronchial wall thickening and mucus plugging at the lung bases. Upper Abdomen: No acute abnormality. Musculoskeletal: No chest wall abnormality. No acute or significant osseous findings. Review of the MIP images confirms the above findings. IMPRESSION: 1. No evidence for an acute pulmonary embolism. 2. The patient is status post prior placement of a thoracic aortic endograft, perhaps related to a prior traumatic injury or dissection. The graft is grossly patent. 3. Intraluminal  filling defect at the level of the distal descending thoracic aorta. This is felt to represent mixing artifact from the adjacent partially thrombosed false lumen. Intraluminal thrombus is difficult to entirely exclude on this study secondary to contrast timing. 4. Aneurysmal descending thoracic aorta measuring up to approximately 5.3 cm. 5. Complete collapse of the right middle lobe. There is a probable filling defect within the proximal right middle lobe bronchus (favored to represent secretions or debris). Consider endobronchial evaluation as clinically indicated. 6. There is some mucous plugging and bronchial wall thickening at the lung bases bilaterally suggestive of reactive or infectious bronchiolitis. 7. Dilated main pulmonary artery which can be seen in patients with elevated pulmonary artery pressures. 8. Cardiomegaly. 9. Aortic Atherosclerosis (ICD10-I70.0) and Emphysema (ICD10-J43.9). Electronically Signed   By: Katherine Mantle M.D.   On:  08/30/2019 19:33   DG Chest Portable 1 View  Result Date: 08/30/2019 CLINICAL DATA:  Fall.  Difficulty breathing. EXAM: PORTABLE CHEST 1 VIEW COMPARISON:  July 26, 2012 FINDINGS: No pneumothorax. A left-sided rib fractures unchanged since 2013, nonacute. Cardiomediastinal silhouette is normal. Mild haziness over the right base is favored to be due to patient rotation. No suspicious infiltrates. A stent is seen in the descending thoracic aorta, new since 2013. No other acute abnormalities. IMPRESSION: No active disease. Electronically Signed   By: Gerome Sam III M.D   On: 08/30/2019 13:59    EKG: Independently reviewed.  Assessment/Plan Principal Problem:   Bimalleolar ankle fracture, right, closed, initial encounter Active Problems:   HTN (hypertension), benign   COPD with acute exacerbation (HCC)   Acute respiratory failure with hypoxia (HCC)   Aneurysm of thoracic aorta (HCC)   1. Pulmonary atelectasis of the right middle lobe as noted on  the CT scan above.  As above spoke with the patient and the daughter at length direct visualization may be a good option here to make certain that there is no endobronchial lesion based on her history of smoking this would be of concern.  Likely this may represent just mucus and the patient wants to do conservative measures so I have gone ahead and started the patient on Mucomyst as well as duo nebs and will also do chest PT on the patient to try to mobilize secretions I would recommend doing a follow-up chest x-ray by Wednesday and if there is still significant abnormality noted then would consider doing direct visualization bronchoscopy.  This could also be done as an outpatient and the patient is agreeable to that. 2. COPD patient has COPD by history she normally follows at [facility] for this and was last seen in early part of last year encouraged to stop smoking was given Nicorette gum to try to start.  Patient also was told to increase her Symbicort and this should be adhered to. 3. Acute respiratory failure with hypoxia apparently she was told by her other physician that she should be on oxygen however because she has ongoing cigarette smoking there was a concern about whether this should be done now on till she is able to quit smoking.  She may need to go home on oxygen on this discharge though    Code Status: Full code  Family Communication: Daughter present in the room Disposition Plan: Home  Time spent: 70 minutes  I have personally obtained a history, examined the patient, evaluated laboratory and imaging results, formulated the assessment and plan and placed orders.  The Patient requires high complexity decision making for assessment and support. Total Time Spent   Yevonne Pax, MD [facility] Pulmonary Critical Care Medicine Sleep Medicine

## 2019-08-31 NOTE — Progress Notes (Signed)
Subjective:  Patient seen in her hospital room on 1A with her daughter.  Patient has transferred out of the ICU.  Patient states that she is not having difficulty breathing.  She complains of moderate right ankle pain.  Objective:   VITALS:   Vitals:   08/31/19 1100 08/31/19 1244 08/31/19 1300 08/31/19 1400  BP: 101/78  108/75 135/63  Pulse: 65  70 68  Resp: 15  15 16   Temp:      TempSrc:      SpO2: 91% 98% 96% 98%  Weight:      Height:        PHYSICAL EXAM: Right lower extremity: Posterior splint in place.  Patient can flex and extend her toes on the right foot.  Her toes appear well perfused.  She has intact sensation light touch in all 5 toes.  The splint is well fitting.  There is no evidence of skin breakdown around the splint.   LABS  Results for orders placed or performed during the hospital encounter of 08/30/19 (from the past 24 hour(s))  CBC with Differential/Platelet     Status: Abnormal   Collection Time: 08/30/19  4:49 PM  Result Value Ref Range   WBC 9.3 4.0 - 10.5 K/uL   RBC 4.71 3.87 - 5.11 MIL/uL   Hemoglobin 12.3 12.0 - 15.0 g/dL   HCT 09/01/19 26.9 - 48.5 %   MCV 82.8 80.0 - 100.0 fL   MCH 26.1 26.0 - 34.0 pg   MCHC 31.5 30.0 - 36.0 g/dL   RDW 46.2 (H) 70.3 - 50.0 %   Platelets 215 150 - 400 K/uL   nRBC 0.0 0.0 - 0.2 %   Neutrophils Relative % 62 %   Neutro Abs 5.7 1.7 - 7.7 K/uL   Lymphocytes Relative 30 %   Lymphs Abs 2.8 0.7 - 4.0 K/uL   Monocytes Relative 8 %   Monocytes Absolute 0.8 0.1 - 1.0 K/uL   Eosinophils Relative 0 %   Eosinophils Absolute 0.0 0.0 - 0.5 K/uL   Basophils Relative 0 %   Basophils Absolute 0.0 0.0 - 0.1 K/uL   Immature Granulocytes 0 %   Abs Immature Granulocytes 0.02 0.00 - 0.07 K/uL  Comprehensive metabolic panel     Status: Abnormal   Collection Time: 08/30/19  4:49 PM  Result Value Ref Range   Sodium 135 135 - 145 mmol/L   Potassium 3.6 3.5 - 5.1 mmol/L   Chloride 98 98 - 111 mmol/L   CO2 26 22 - 32 mmol/L   Glucose, Bld 76 70 - 99 mg/dL   BUN 33 (H) 6 - 20 mg/dL   Creatinine, Ser 09/01/19 (H) 0.44 - 1.00 mg/dL   Calcium 8.6 (L) 8.9 - 10.3 mg/dL   Total Protein 6.9 6.5 - 8.1 g/dL   Albumin 3.1 (L) 3.5 - 5.0 g/dL   AST 31 15 - 41 U/L   ALT 15 0 - 44 U/L   Alkaline Phosphatase 87 38 - 126 U/L   Total Bilirubin 0.9 0.3 - 1.2 mg/dL   GFR calc non Af Amer 40 (L) >60 mL/min   GFR calc Af Amer 46 (L) >60 mL/min   Anion gap 11 5 - 15  Troponin I (High Sensitivity)     Status: None   Collection Time: 08/30/19  4:49 PM  Result Value Ref Range   Troponin I (High Sensitivity) 17 <18 ng/L  Brain natriuretic peptide     Status: None   Collection Time:  08/30/19  4:49 PM  Result Value Ref Range   B Natriuretic Peptide 75.0 0.0 - 100.0 pg/mL  Protime-INR     Status: None   Collection Time: 08/30/19  4:49 PM  Result Value Ref Range   Prothrombin Time 13.7 11.4 - 15.2 seconds   INR 1.1 0.8 - 1.2  POC SARS Coronavirus 2 Ag     Status: None   Collection Time: 08/30/19  6:20 PM  Result Value Ref Range   SARS Coronavirus 2 Ag NEGATIVE NEGATIVE  Blood gas, arterial     Status: Abnormal   Collection Time: 08/30/19  7:50 PM  Result Value Ref Range   FIO2 44.00    Delivery systems NASAL CANNULA    pH, Arterial 7.34 (L) 7.350 - 7.450   pCO2 arterial 44 32.0 - 48.0 mmHg   pO2, Arterial 70 (L) 83.0 - 108.0 mmHg   Bicarbonate 23.7 20.0 - 28.0 mmol/L   Acid-base deficit 2.2 (H) 0.0 - 2.0 mmol/L   O2 Saturation 92.7 %   Patient temperature 37.0    Collection site LEFT RADIAL    Sample type ARTERIAL DRAW    Allens test (pass/fail) PASS PASS  HIV Antibody (routine testing w rflx)     Status: None   Collection Time: 08/30/19 10:43 PM  Result Value Ref Range   HIV Screen 4th Generation wRfx NON REACTIVE NON REACTIVE  APTT     Status: None   Collection Time: 08/30/19 10:43 PM  Result Value Ref Range   aPTT 31 24 - 36 seconds  Type and screen If not already done in ED     Status: None   Collection Time:  08/30/19 10:43 PM  Result Value Ref Range   ABO/RH(D) O POS    Antibody Screen NEG    Sample Expiration      09/02/2019,2359 Performed at [facility], [address] [place], [place] [address]   Glucose, capillary     Status: Abnormal   Collection Time: 08/31/19 12:12 AM  Result Value Ref Range   Glucose-Capillary 146 (H) 70 - 99 mg/dL  MRSA PCR Screening     Status: None   Collection Time: 08/31/19 12:16 AM   Specimen: Nasal Mucosa; Nasopharyngeal  Result Value Ref Range   MRSA by PCR NEGATIVE NEGATIVE  CBC     Status: Abnormal   Collection Time: 08/31/19  4:19 AM  Result Value Ref Range   WBC 5.0 4.0 - 10.5 K/uL   RBC 4.94 3.87 - 5.11 MIL/uL   Hemoglobin 12.8 12.0 - 15.0 g/dL   HCT 10.6 26.9 - 48.5 %   MCV 83.4 80.0 - 100.0 fL   MCH 25.9 (L) 26.0 - 34.0 pg   MCHC 31.1 30.0 - 36.0 g/dL   RDW 46.2 (H) 70.3 - 50.0 %   Platelets 209 150 - 400 K/uL   nRBC 0.0 0.0 - 0.2 %  Basic metabolic panel     Status: Abnormal   Collection Time: 08/31/19  4:19 AM  Result Value Ref Range   Sodium 133 (L) 135 - 145 mmol/L   Potassium 4.1 3.5 - 5.1 mmol/L   Chloride 98 98 - 111 mmol/L   CO2 29 22 - 32 mmol/L   Glucose, Bld 142 (H) 70 - 99 mg/dL   BUN 36 (H) 6 - 20 mg/dL   Creatinine, Ser 9.38 (H) 0.44 - 1.00 mg/dL   Calcium 8.5 (L) 8.9 - 10.3 mg/dL   GFR calc non Af Denyse Dago  45 (L) >60 mL/min   GFR calc Af Amer 53 (L) >60 mL/min   Anion gap 6 5 - 15  Protime-INR     Status: None   Collection Time: 08/31/19  4:19 AM  Result Value Ref Range   Prothrombin Time 13.3 11.4 - 15.2 seconds   INR 1.0 0.8 - 1.2  ABO/Rh     Status: None   Collection Time: 08/31/19  4:19 AM  Result Value Ref Range   ABO/RH(D)      O POS Performed at [facility], [address], [place], [place] [address]     DG Tibia/Fibula Right  Result Date: 08/30/2019 CLINICAL DATA:  Pain after fall. Deformity right ankle. EXAM: RIGHT TIBIA AND FIBULA - 2 VIEW COMPARISON:  None. FINDINGS: Ankle  fractures are identified. Please see the ankle films for full description. The remainder of the tibia and fibular intact. IMPRESSION: Ankle fractures are identified. These will be described on the dedicated ankle films. The remainder of the tibia and fibular intact. The distal femur is intact. Electronically Signed   By: Dorise Bullion III M.D   On: 08/30/2019 14:56   DG Ankle Complete Right  Result Date: 08/30/2019 CLINICAL DATA:  Pain after fall EXAM: RIGHT ANKLE - COMPLETE 3+ VIEW COMPARISON:  None. FINDINGS: A displaced distal fibular fracture is identified. And mildly displaced comminuted medial malleolar fracture is identified. A linear soft tissue calcification is seen anterior to the talar neck, likely displaced fragment from the ankle fractures. The talus itself appears intact. Punctate foci of high attenuation are projected in the soft tissues adjacent to the talus, age indeterminate. IMPRESSION: 1. Displaced fractures of the distal fibula and medial malleolus. The medial malleolar fracture is comminuted. A linear calcification in the soft tissues anterior to the talus is likely a displaced fracture fragment associated with the ankle fractures. Soft tissue swelling is identified. 2. Punctate foci of high attenuation in the soft tissues adjacent to the talus are consistent with foreign bodies, age indeterminate. Electronically Signed   By: Dorise Bullion III M.D   On: 08/30/2019 14:58   CT Angio Chest PE W and/or Wo Contrast  Result Date: 08/30/2019 CLINICAL DATA:  Shortness of breath chest pain. EXAM: CT ANGIOGRAPHY CHEST WITH CONTRAST TECHNIQUE: Multidetector CT imaging of the chest was performed using the standard protocol during bolus administration of intravenous contrast. Multiplanar CT image reconstructions and MIPs were obtained to evaluate the vascular anatomy. CONTRAST:  24mL OMNIPAQUE IOHEXOL 350 MG/ML SOLN COMPARISON:  None. FINDINGS: Cardiovascular: Contrast injection is sufficient to  demonstrate satisfactory opacification of the pulmonary arteries to the segmental level. There is no pulmonary embolus. The main pulmonary artery is significantly dilated measuring approximately 3.9 cm in diameter. There is no CT evidence of acute right heart strain. The patient is status post aortic stent graft placement involving the descending thoracic aorta. The stent graft appears to be grossly patent. At the level of the distal descending thoracic aorta, there is a questionable filling defect (axial series 5, image 235). There is a partially thrombosed false lumen with the aorta at this level measuring up to approximately 5.3 cm in diameter on the coronal series (series 7, image 49). Heart size is enlarged. Coronary artery calcifications are noted. Thoracic aortic calcifications are noted. Mediastinum/Nodes: --No mediastinal or hilar lymphadenopathy. There are few slightly prominent distal paraesophageal lymph nodes unknown clinical significance. --No axillary lymphadenopathy. --No supraclavicular lymphadenopathy. --Normal thyroid gland. --The esophagus is unremarkable Lungs/Pleura: Emphysematous changes are noted  bilaterally. There is atelectasis at the right lung base. There is a small amount of atelectasis at the left lung base. There is complete collapse of the right middle lobe. There is a probable filling defect within the proximal right middle lobe bronchus. The trachea is otherwise unremarkable. There is some mild bilateral bronchial wall thickening and mucus plugging at the lung bases. Upper Abdomen: No acute abnormality. Musculoskeletal: No chest wall abnormality. No acute or significant osseous findings. Review of the MIP images confirms the above findings. IMPRESSION: 1. No evidence for an acute pulmonary embolism. 2. The patient is status post prior placement of a thoracic aortic endograft, perhaps related to a prior traumatic injury or dissection. The graft is grossly patent. 3. Intraluminal  filling defect at the level of the distal descending thoracic aorta. This is felt to represent mixing artifact from the adjacent partially thrombosed false lumen. Intraluminal thrombus is difficult to entirely exclude on this study secondary to contrast timing. 4. Aneurysmal descending thoracic aorta measuring up to approximately 5.3 cm. 5. Complete collapse of the right middle lobe. There is a probable filling defect within the proximal right middle lobe bronchus (favored to represent secretions or debris). Consider endobronchial evaluation as clinically indicated. 6. There is some mucous plugging and bronchial wall thickening at the lung bases bilaterally suggestive of reactive or infectious bronchiolitis. 7. Dilated main pulmonary artery which can be seen in patients with elevated pulmonary artery pressures. 8. Cardiomegaly. 9. Aortic Atherosclerosis (ICD10-I70.0) and Emphysema (ICD10-J43.9). Electronically Signed   By: Katherine Mantle M.D.   On: 08/30/2019 19:33   DG Chest Portable 1 View  Result Date: 08/30/2019 CLINICAL DATA:  Fall.  Difficulty breathing. EXAM: PORTABLE CHEST 1 VIEW COMPARISON:  July 26, 2012 FINDINGS: No pneumothorax. A left-sided rib fractures unchanged since 2013, nonacute. Cardiomediastinal silhouette is normal. Mild haziness over the right base is favored to be due to patient rotation. No suspicious infiltrates. A stent is seen in the descending thoracic aorta, new since 2013. No other acute abnormalities. IMPRESSION: No active disease. Electronically Signed   By: Gerome Sam III M.D   On: 08/30/2019 13:59    Assessment/Plan: * Surgery Date in Future *   Principal Problem:   Bimalleolar ankle fracture, right, closed, initial encounter Active Problems:   HTN (hypertension), benign   COPD with acute exacerbation (HCC)   Acute respiratory failure with hypoxia (HCC)   Aneurysm of thoracic aorta (HCC)  I discussed this case with Dr. Esaw Grandchild by secure text  message.  Patient is likely to be medically cleared for surgery tomorrow.  She is tentatively on the OR schedule for 1 PM.  Patient is n.p.o. after midnight.  Patient will undergo ORIF of her right bimalleolar ankle fracture tomorrow.    Juanell Fairly , MD 08/31/2019, 4:41 PM

## 2019-08-31 NOTE — Progress Notes (Signed)
Surgery cancelled per Dr. Martha Clan.. Patient upset. Started on a regular diet. Treated for pain x3.

## 2019-09-01 ENCOUNTER — Encounter
Admission: EM | Disposition: A | Discharge status: Home Health | Payer: Self-pay | Source: Home / Self Care | Attending: Internal Medicine

## 2019-09-01 ENCOUNTER — Inpatient Hospital Stay: Payer: Medicaid Other | Admitting: Certified Registered Nurse Anesthetist

## 2019-09-01 ENCOUNTER — Encounter: Payer: Self-pay | Admitting: Internal Medicine

## 2019-09-01 ENCOUNTER — Inpatient Hospital Stay (HOSPITAL_COMMUNITY)
Admit: 2019-09-01 | Discharge: 2019-09-01 | Disposition: A | Discharge status: Home / Self Care | Payer: Medicaid Other | Attending: Internal Medicine | Admitting: Internal Medicine

## 2019-09-01 ENCOUNTER — Inpatient Hospital Stay: Payer: Medicaid Other

## 2019-09-01 DIAGNOSIS — I340 Nonrheumatic mitral (valve) insufficiency: Secondary | ICD-10-CM

## 2019-09-01 DIAGNOSIS — I351 Nonrheumatic aortic (valve) insufficiency: Secondary | ICD-10-CM

## 2019-09-01 DIAGNOSIS — R9431 Abnormal electrocardiogram [ECG] [EKG]: Secondary | ICD-10-CM

## 2019-09-01 DIAGNOSIS — I350 Nonrheumatic aortic (valve) stenosis: Secondary | ICD-10-CM

## 2019-09-01 HISTORY — PX: ORIF ANKLE FRACTURE: SHX5408

## 2019-09-01 LAB — ECHOCARDIOGRAM COMPLETE
Height: 62.205 in
Weight: 2564.39 oz

## 2019-09-01 LAB — BASIC METABOLIC PANEL
Anion gap: 9 (ref 5–15)
BUN: 33 mg/dL — ABNORMAL HIGH (ref 8–23)
CO2: 26 mmol/L (ref 22–32)
Calcium: 8.9 mg/dL (ref 8.9–10.3)
Chloride: 103 mmol/L (ref 98–111)
Creatinine, Ser: 1.02 mg/dL — ABNORMAL HIGH (ref 0.44–1.00)
GFR calc Af Amer: >60 mL/min (ref >60)
GFR calc non Af Amer: 59 mL/min — ABNORMAL LOW (ref >60)
Glucose, Bld: 167 mg/dL — ABNORMAL HIGH (ref 70–99)
Potassium: 4.3 mmol/L (ref 3.5–5.1)
Sodium: 138 mmol/L (ref 135–145)

## 2019-09-01 LAB — CBC WITH DIFFERENTIAL/PLATELET
Abs Immature Granulocytes: 0.05 10*3/uL (ref 0.00–0.07)
Basophils Absolute: 0 10*3/uL (ref 0.0–0.1)
Basophils Relative: 0 %
Eosinophils Absolute: 0 10*3/uL (ref 0.0–0.5)
Eosinophils Relative: 0 %
HCT: 37.3 % (ref 36.0–46.0)
Hemoglobin: 11.7 g/dL — ABNORMAL LOW (ref 12.0–15.0)
Immature Granulocytes: 1 %
Lymphocytes Relative: 18 %
Lymphs Abs: 1.3 10*3/uL (ref 0.7–4.0)
MCH: 26 pg (ref 26.0–34.0)
MCHC: 31.4 g/dL (ref 30.0–36.0)
MCV: 82.9 fL (ref 80.0–100.0)
Monocytes Absolute: 0.6 10*3/uL (ref 0.1–1.0)
Monocytes Relative: 9 %
Neutro Abs: 5.1 10*3/uL (ref 1.7–7.7)
Neutrophils Relative %: 72 %
Platelets: 224 10*3/uL (ref 150–400)
RBC: 4.5 MIL/uL (ref 3.87–5.11)
RDW: 16.2 % — ABNORMAL HIGH (ref 11.5–15.5)
WBC: 7.1 10*3/uL (ref 4.0–10.5)
nRBC: 0 % (ref 0.0–0.2)

## 2019-09-01 LAB — URINE CULTURE: Culture: NO GROWTH

## 2019-09-01 LAB — MAGNESIUM: Magnesium: 2.1 mg/dL (ref 1.7–2.4)

## 2019-09-01 SURGERY — OPEN REDUCTION INTERNAL FIXATION (ORIF) ANKLE FRACTURE
Anesthesia: Spinal | Site: Ankle | Laterality: Right

## 2019-09-01 MED ORDER — BISACODYL 10 MG RE SUPP: 10.0000 mg | Freq: Every day | RECTAL | Status: DC | PRN

## 2019-09-01 MED ORDER — ENOXAPARIN SODIUM 40 MG/0.4ML ~~LOC~~ SOLN
40.0000 mg | SUBCUTANEOUS | Status: DC
  Administered 2019-09-02 – 2019-09-04 (×3): 40 mg via SUBCUTANEOUS
  Filled 2019-09-01 (×3): qty 0.4

## 2019-09-01 MED ORDER — PROPOFOL 500 MG/50ML IV EMUL
INTRAVENOUS | Status: DC | PRN
  Administered 2019-09-01: 125 ug/kg/min via INTRAVENOUS

## 2019-09-01 MED ORDER — BUPIVACAINE HCL (PF) 0.25 % IJ SOLN
INTRAMUSCULAR | Status: AC
  Filled 2019-09-01: qty 30

## 2019-09-01 MED ORDER — CEFAZOLIN SODIUM-DEXTROSE 1-4 GM/50ML-% IV SOLN
1.0000 g | Freq: Four times a day (QID) | INTRAVENOUS | Status: AC
  Administered 2019-09-01 – 2019-09-02 (×2): 1 g via INTRAVENOUS
  Filled 2019-09-01 (×2): qty 50

## 2019-09-01 MED ORDER — PROPOFOL 10 MG/ML IV BOLUS
INTRAVENOUS | Status: DC | PRN
  Administered 2019-09-01: 40 mg via INTRAVENOUS
  Administered 2019-09-01: 30 mg via INTRAVENOUS

## 2019-09-01 MED ORDER — LACTATED RINGERS IV SOLN: INTRAVENOUS | Status: DC | PRN

## 2019-09-01 MED ORDER — RISAQUAD PO CAPS
2.0000 | ORAL_CAPSULE | Freq: Two times a day (BID) | ORAL | Status: DC
  Administered 2019-09-01 – 2019-09-04 (×6): 2 via ORAL
  Filled 2019-09-01 (×8): qty 2

## 2019-09-01 MED ORDER — HYDROMORPHONE HCL 1 MG/ML IJ SOLN: 0.5000 mg | INTRAMUSCULAR | Status: DC | PRN

## 2019-09-01 MED ORDER — TETRACAINE HCL 1 % IJ SOLN
INTRAMUSCULAR | Status: DC | PRN
  Administered 2019-09-01: 1 mg via INTRASPINAL

## 2019-09-01 MED ORDER — OXYCODONE HCL 5 MG PO TABS
10.0000 mg | ORAL_TABLET | ORAL | Status: DC | PRN
  Administered 2019-09-02 (×2): 10 mg via ORAL
  Filled 2019-09-01: qty 2

## 2019-09-01 MED ORDER — SENNA 8.6 MG PO TABS
1.0000 | ORAL_TABLET | Freq: Two times a day (BID) | ORAL | Status: DC
  Administered 2019-09-01 – 2019-09-03 (×5): 8.6 mg via ORAL
  Filled 2019-09-01 (×6): qty 1

## 2019-09-01 MED ORDER — MIDAZOLAM HCL 5 MG/5ML IJ SOLN
INTRAMUSCULAR | Status: DC | PRN
  Administered 2019-09-01 (×2): 2 mg via INTRAVENOUS

## 2019-09-01 MED ORDER — BUPIVACAINE HCL (PF) 0.5 % IJ SOLN
INTRAMUSCULAR | Status: DC | PRN
  Administered 2019-09-01: 2.5 mL

## 2019-09-01 MED ORDER — ALUM & MAG HYDROXIDE-SIMETH 200-200-20 MG/5ML PO SUSP
30.0000 mL | ORAL | Status: DC | PRN
  Administered 2019-09-03: 30 mL via ORAL
  Filled 2019-09-01: qty 30

## 2019-09-01 MED ORDER — TRAMADOL HCL 50 MG PO TABS
50.0000 mg | ORAL_TABLET | Freq: Four times a day (QID) | ORAL | Status: DC
  Administered 2019-09-01 – 2019-09-04 (×11): 50 mg via ORAL
  Filled 2019-09-01 (×11): qty 1

## 2019-09-01 MED ORDER — NEOMYCIN-POLYMYXIN B GU 40-200000 IR SOLN
Status: DC | PRN
  Administered 2019-09-01: 4 mL

## 2019-09-01 MED ORDER — ONDANSETRON HCL 4 MG PO TABS: 4.0000 mg | ORAL_TABLET | Freq: Four times a day (QID) | ORAL | Status: DC | PRN

## 2019-09-01 MED ORDER — BUPIVACAINE HCL 0.25 % IJ SOLN
INTRAMUSCULAR | Status: DC | PRN
  Administered 2019-09-01: 30 mL

## 2019-09-01 MED ORDER — OXYCODONE HCL 5 MG PO TABS
5.0000 mg | ORAL_TABLET | ORAL | Status: DC | PRN
  Administered 2019-09-01 – 2019-09-03 (×4): 10 mg via ORAL
  Administered 2019-09-04: 5 mg via ORAL
  Filled 2019-09-01: qty 2
  Filled 2019-09-01: qty 1
  Filled 2019-09-01 (×5): qty 2

## 2019-09-01 MED ORDER — DOCUSATE SODIUM 100 MG PO CAPS
100.0000 mg | ORAL_CAPSULE | Freq: Two times a day (BID) | ORAL | Status: DC
  Administered 2019-09-01 – 2019-09-03 (×5): 100 mg via ORAL
  Filled 2019-09-01 (×6): qty 1

## 2019-09-01 MED ORDER — ONDANSETRON HCL 4 MG/2ML IJ SOLN: 4.0000 mg | Freq: Four times a day (QID) | INTRAMUSCULAR | Status: DC | PRN

## 2019-09-01 MED ORDER — POLYETHYLENE GLYCOL 3350 17 G PO PACK: 17.0000 g | PACK | Freq: Every day | ORAL | Status: DC | PRN

## 2019-09-01 MED ORDER — ACETAMINOPHEN 500 MG PO TABS
1000.0000 mg | ORAL_TABLET | Freq: Four times a day (QID) | ORAL | Status: AC
  Administered 2019-09-01 – 2019-09-02 (×4): 1000 mg via ORAL
  Filled 2019-09-01 (×4): qty 2

## 2019-09-01 MED ORDER — SODIUM CHLORIDE 0.9 % IV SOLN
INTRAVENOUS | Status: DC | PRN
  Administered 2019-09-01: 15 ug/min via INTRAVENOUS

## 2019-09-01 SURGICAL SUPPLY — 55 items
BIT DRILL 2.5X110 QC LCP DISP (BIT) ×6 IMPLANT
BIT DRILL CANN 2.7X625 NONSTRL (BIT) ×3 IMPLANT
BLADE SURG 15 STRL LF DISP TIS (BLADE) ×1 IMPLANT
BLADE SURG 15 STRL SS (BLADE) ×2
BNDG ELASTIC 4X5.8 VLCR NS LF (GAUZE/BANDAGES/DRESSINGS) ×6 IMPLANT
BNDG ESMARK 6X12 TAN STRL LF (GAUZE/BANDAGES/DRESSINGS) ×3 IMPLANT
CLOSURE WOUND 1/2 X4 (GAUZE/BANDAGES/DRESSINGS) ×2
COVER WAND RF STERILE (DRAPES) ×3 IMPLANT
CUFF TOURN SGL QUICK 24 (TOURNIQUET CUFF) ×2
CUFF TRNQT CYL 24X4X16.5-23 (TOURNIQUET CUFF) ×1 IMPLANT
DRAPE FLUOR MINI C-ARM 54X84 (DRAPES) ×3 IMPLANT
DRAPE INCISE IOBAN 66X45 STRL (DRAPES) IMPLANT
DRAPE U-SHAPE 47X51 STRL (DRAPES) ×3 IMPLANT
DURAPREP 26ML APPLICATOR (WOUND CARE) ×6 IMPLANT
ELECT REM PT RETURN 9FT ADLT (ELECTROSURGICAL) ×3
ELECTRODE REM PT RTRN 9FT ADLT (ELECTROSURGICAL) ×1 IMPLANT
GAUZE SPONGE 4X4 12PLY STRL (GAUZE/BANDAGES/DRESSINGS) ×3 IMPLANT
GAUZE XEROFORM 1X8 LF (GAUZE/BANDAGES/DRESSINGS) ×3 IMPLANT
GLOVE BIOGEL PI IND STRL 9 (GLOVE) ×1 IMPLANT
GLOVE BIOGEL PI INDICATOR 9 (GLOVE) ×2
GLOVE SURG 9.0 ORTHO LTXF (GLOVE) ×6 IMPLANT
GOWN STRL REUS TWL 2XL XL LVL4 (GOWN DISPOSABLE) ×3 IMPLANT
GOWN STRL REUS W/ TWL LRG LVL3 (GOWN DISPOSABLE) ×1 IMPLANT
GOWN STRL REUS W/TWL LRG LVL3 (GOWN DISPOSABLE) ×2
GUIDEWARE NON THREAD 1.25X150 (WIRE) ×3
GUIDEWIRE NON THREAD 1.25X150 (WIRE) ×1 IMPLANT
KIT TURNOVER KIT A (KITS) ×3 IMPLANT
LABEL OR SOLS (LABEL) ×3 IMPLANT
NS IRRIG 1000ML POUR BTL (IV SOLUTION) ×3 IMPLANT
PACK EXTREMITY ARMC (MISCELLANEOUS) ×3 IMPLANT
PAD ABD DERMACEA PRESS 5X9 (GAUZE/BANDAGES/DRESSINGS) ×18 IMPLANT
PAD CAST CTTN 4X4 STRL (SOFTGOODS) ×2 IMPLANT
PADDING CAST COTTON 4X4 STRL (SOFTGOODS) ×4
PLATE LCP 3.5 1/3 TUB 7HX81 (Plate) ×3 IMPLANT
SCREW CANC FT 4.0X20 (Screw) ×3 IMPLANT
SCREW CANC FT/18 4.0 (Screw) ×3 IMPLANT
SCREW CANN L THRD/40 4.0 (Screw) ×6 IMPLANT
SCREW CORTEX 3.5 12MM (Screw) ×2 IMPLANT
SCREW CORTEX 3.5 14MM (Screw) ×6 IMPLANT
SCREW CORTEX 3.5 16MM (Screw) ×2 IMPLANT
SCREW CORTEX 3.5 18MM (Screw) ×2 IMPLANT
SCREW LOCK CORT ST 3.5X12 (Screw) ×1 IMPLANT
SCREW LOCK CORT ST 3.5X14 (Screw) ×3 IMPLANT
SCREW LOCK CORT ST 3.5X16 (Screw) ×1 IMPLANT
SCREW LOCK CORT ST 3.5X18 (Screw) ×1 IMPLANT
SPLINT CAST 1 STEP 4X30 (MISCELLANEOUS) ×6 IMPLANT
SPONGE LAP 18X18 RF (DISPOSABLE) ×3 IMPLANT
STAPLER SKIN PROX 35W (STAPLE) ×3 IMPLANT
STOCKINETTE STRL 6IN 960660 (GAUZE/BANDAGES/DRESSINGS) ×3 IMPLANT
STRIP CLOSURE SKIN 1/2X4 (GAUZE/BANDAGES/DRESSINGS) ×4 IMPLANT
SUT VIC AB 2-0 SH 27 (SUTURE) ×4
SUT VIC AB 2-0 SH 27XBRD (SUTURE) ×2 IMPLANT
SYR 30ML LL (SYRINGE) ×3 IMPLANT
TAPE MICROPORE 2IN (TAPE) ×3 IMPLANT
WASHER 7MM DIA (Washer) ×6 IMPLANT

## 2019-09-01 NOTE — Progress Notes (Signed)
EKG completed and placed on chart 

## 2019-09-01 NOTE — Progress Notes (Signed)
*  PRELIMINARY RESULTS* Echocardiogram 2D Echocardiogram has been performed.  Jamie Roman 09/01/2019, 1:43 PM

## 2019-09-01 NOTE — Op Note (Signed)
09/01/2019  5:54 PM  PATIENT:  Jamie Roman    PRE-OPERATIVE DIAGNOSIS:  RIGHT CLOSED BIMALLEOLAR ANKLE FRACTURE  POST-OPERATIVE DIAGNOSIS:  Same  PROCEDURE:  OPEN REDUCTION INTERNAL FIXATION (ORIF) RIGHT BIMALLEOLAR ANKLE FRACTURE  SURGEON:  Thornton Park, MD  ANESTHESIA:   Spinal  PREOPERATIVE INDICATIONS:  Jamie Roman is a  59 y.o. female with a diagnosis of RIGHT BIMALLEOLAR ANKLE FRACTURE who failed conservative measures and elected for surgical management.    I discussed the risks and benefits of surgery. The risks include but are not limited to infection, bleeding requiring blood transfusion, nerve or blood vessel injury, joint stiffness or loss of motion, persistent pain, weakness or instability, malunion, nonunion and hardware failure and the need for further surgery. Medical risks include but are not limited to DVT and pulmonary embolism, myocardial infarction, stroke, pneumonia, respiratory failure and death. Patient understood these risks and wished to proceed.   OPERATIVE IMPLANTS: Synthes 7 hole one third tubular plate for lateral fixation and Synthes 4.0 mm cannulated screws for medial fixation  OPERATIVE FINDINGS: Right bimalleolar ankle fracture with lateral talar subluxation  OPERATIVE PROCEDURE:   Patient was met in the preoperative area. The right leg was signed my initials and the word yes according the hospital's correct site of surgery protocol.  The patient was brought to the operating room where she underwent placement of a spinal. The patient was placed supine on the operative table. A bump was placed under the right hip. A tourniquet was applied to the right thigh.  The lower extremity was prepped and draped in a sterile fashion. A timeout was performed to verify the patient's name, date of birth, medical record number, correct site of surgery and correct procedure to be performed. It was also used to verify the patient received antibiotics, and that  all appropriate instruments, implants and radiographic studies were available in the room. Once all in attendance were in agreement, the case began.  The right lower extremity was exsanguinated with an Esmarch. The tourniquet was inflated to 275 mmHg. This was applied for a total of 93 minutes. A lateral incision was made over the fibula. The subcutaneous tissues were dissected with the Metzenbaum scissor and pickup. Care was taken to avoid injury to the superficial peroneal nerve. The lateral malleolus fracture was identified and irrigated and fracture hematoma was removed. Soft tissue was removed from the fracture site using a periosteal elevator. A fracture reduction clamp was then used to reduce the fracture to an anatomic position.     The lateral malleolus was then drilled in an AP direction, perpendicular to the fracture site to allow for placement of the lag screw.   A single lag screw, 18 mm in length, was advanced across the fracture site by hand. This compressed the fracture.   A 7 hole, 1/3 tubular plate was then contoured and placed along the lateral fibula. Bicortical screws were placed proximal to the fracture and fully threaded cancellus screws were placed distal the fracture. The fracture reduction and hardware placement were confirmed on AP and lateral imaging.  Once the lateral malleolus was plated, the attention was turned to the medial ankle. A small vertical incision was made over the tip of the medial malleolus.  Soft tissue was dissected with some with the Metzenbaum scissor and pickup. The fracture of the medial malleolus was identified. This was reduced with a dental pick. 2 threaded K wires for the 4.0 cannulated screws were then advanced through the tip  of the medial malleolus across the fracture site and into the distal tibia. The position of the K wires was evaluated on AP and lateral FluoroScan images. The length of the wires were measured with a depth gauge and were determined  to be 40 and 40 mm in length. The wires were then overdrilled with a cannulated drill for the 4.0 cannulated screws. The two long threaded 4.0 cannulated screws with washers were then advanced into position by hand, compressing the medial malleolus fracture.    The ankle was then examined under fluoroscopy.  A stress test of the right ankle was then performed under fluoroscopy.  This test did not reveal any syndesmotic injury or opening of the medial clear space.  The medial and lateral incisions were then copiously irrigated. The subcutaneous tissue was closed with 2-0 Vicryl and the skin approximated staples. A dry sterile dressing was applied along with an AO splint. The patient's ankle was positioned in neutral. The pateint was then awoken from anesthesia, transferred to hospital bed and brought to the PACU in stable condition. I was scrubbed and present the entire case and all sharp and instrument counts were correct at conclusion the case. I spoke to the patient's daughter by phone from the PACU to let her know the case was performed successfully and without complication and the patient was stable in recovery room.    Timoteo Gaul, MD

## 2019-09-01 NOTE — Anesthesia Procedure Notes (Signed)
Spinal  Patient location during procedure: OR Start time: 09/01/2019 3:30 PM End time: 09/01/2019 3:35 PM Staffing Performed: resident/CRNA  Resident/CRNA: Nelda Marseille, CRNA Preanesthetic Checklist Completed: patient identified, IV checked, site marked, risks and benefits discussed, surgical consent, monitors and equipment checked, pre-op evaluation and timeout performed Spinal Block Patient position: sitting Prep: Betadine Patient monitoring: heart rate, continuous pulse ox, blood pressure and cardiac monitor Approach: midline Location: L3-4 Injection technique: single-shot Needle Needle type: Whitacre and Introducer  Needle gauge: 25 G Needle length: 9 cm Assessment Sensory level: T10 Additional Notes Negative paresthesia. Negative blood return. Positive free-flowing CSF. Expiration date of kit checked and confirmed. Patient tolerated procedure well, without complications.

## 2019-09-01 NOTE — Progress Notes (Signed)
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  She presented to the ED on 1/31 after mechanical fall at home and acute onset of right ankle pain without ability to bear weight.  In the ED, patient was found to be hypoxic with O2 sat of 82 %, improved on 6 L/min oxygen by nasal cannula.  Patient's daughter reported that she is supposed be on home oxygen but does not have it.  Apparently noncompliant with pulmonology follow-up.  Other vitals were stable.  Chest x-ray was negative, COVID-19 was negative.  CTA chest ruled out PE.  She was found to have tib-fib fracture of the right ankle on x-ray.  Orthopedics was consulted and patient admitted to hospitalist service due to hypoxia.  Patient was subsequently on BiPAP.  Has since been weaned off and is down to 2 L/min oxygen by nasal cannula as of this morning.  CTA chest did show right middle lobe collapse likely due to mucous plugging, as well as bronchial wall thickening at the bilateral bases.  Pulmonary (Dr. Welton Flakes) was consulted for possible bronchoscopy.    Subjective 2/2: Patient seen this morning at bedside.  No acute events reported overnight.  She reports her ankle pain is well controlled this morning.  Denies any shortness of breath or wheezing.  States her breathing feels at her baseline.  Denies any chest pain, nausea vomiting or diarrhea, fever chills or other complaints.    Today is patient's birthday.   Assessment & Plan:   Principal Problem:   Bimalleolar ankle fracture, right, closed, initial encounter Active Problems:   HTN (hypertension), benign   COPD with acute exacerbation (HCC)  Acute respiratory failure with hypoxia (HCC)   Aneurysm of thoracic aorta (HCC)  Pulm: Mucomyst, duonebs & chest PT, bronch later to eval if malignancy, increase her symbicort and ?was told should be on home o2  Bimalleolar ankle fracture, right, closed, initial encounter Secondary to mechanical fall.  Cleared by cardiology for surgery this afternoon, 2/2 --Ortho following, Dr. Martha Clan --Nonweightbearing on right lower extremity --Pain control --Bowel regimen --PT eval postop  Acute respiratory failure with hypoxia secondary to Acute exacerbation of COPD  Patient was initially requiring 6 L/min, then placed on BiPAP, has since been weaned to 2 L/min by nasal cannula.  Had upper respiratory illness recently and increased COPD symptoms requiring increased use of rescue inhaler. --Supplemental oxygen, maintain O2 sat greater than 88% --DuoNebs scheduled --Albuterol every 2 hours as needed --Prednisone 40 daily --Incentive spirometry and flutter valve --Continue Rocephin and doxy --Continue prednisone --Antitussives  --Strongly advised smoking cessation --Pulmonary consulted due to right middle lobe collapse seen on CTA chest    --added Mucomyst and chest PT    --Will need bronchoscopy at some point to rule out endobronchial malignancy  Descending thoracic aortic aneurysm -chronic, status post stent Measured 5.3 cm on CTA chest upon admission.  Follow-up outpatient.  Essential hypertension -continue home amlodipine, Coreg, losartan, hydralazine Hyperlipidemia -continue Lipitor and hold aspirin for surgery   DVT prophylaxis: SCDs   Code Status: Full Code  Family Communication: None at bedside Disposition Plan: Pending surgery and PT eval postoperatively.  Consultants:   Orthopedics  Pulmonology  Procedures:   None  Antimicrobials:   None   Objective: Vitals:   08/31/19 1648 08/31/19 2000 08/31/19 2333 09/01/19 0421  BP: 135/63  140/65   Pulse: 72   72   Resp:   19   Temp: 98.5 F (36.9 C)  98.2 F (36.8 C)   TempSrc: Oral  Oral   SpO2: 100% 92% 96% 96%  Weight:      Height:        Intake/Output Summary (Last 24 hours) at 09/01/2019 0735 Last data filed at 09/01/2019 0600 Gross per 24 hour  Intake 376.15 ml  Output 1150 ml  Net -773.85 ml   Filed Weights   08/30/19 1309 08/31/19 0000  Weight: 68 kg 72.7 kg    Examination:  General exam: awake, alert, no acute distress HEENT: moist mucus membranes, hearing grossly normal  Respiratory system: Decreased breath sounds without wheezing or rhonchi, normal respiratory effort, 2 L/min. Cardiovascular system: normal S1/S2, RRR, no JVD, murmurs, rubs, gallops, no pedal edema.   Central nervous system: alert and oriented x4. no gross focal neurologic deficits, normal speech Extremities: Right lower extremity dressing/splint intact with good distal sensation, normal tone Skin: dry, intact, normal temperature Psychiatry: normal mood, congruent affect, judgement and insight appear normal    Data Reviewed: I have personally reviewed following labs and imaging studies  CBC: Recent Labs  Lab 08/30/19 1649 08/31/19 0419 09/01/19 0503  WBC 9.3 5.0 7.1  NEUTROABS 5.7  --  5.1  HGB 12.3 12.8 11.7*  HCT 39.0 41.2 37.3  MCV 82.8 83.4 82.9  PLT 215 209 224   Basic Metabolic Panel: Recent Labs  Lab 08/30/19 1649 08/31/19 0419 09/01/19 0503  NA 135 133* 138  K 3.6 4.1 4.3  CL 98 98 103  CO2 26 29 26   GLUCOSE 76 142* 167*  BUN 33* 36* 33*  CREATININE 1.42* 1.28* 1.02*  CALCIUM 8.6* 8.5* 8.9  MG  --   --  2.1   GFR: Estimated Creatinine Clearance: 54.3 mL/min (A) (by C-G formula based on SCr of 1.02 mg/dL (H)). Liver Function Tests: Recent Labs  Lab 08/30/19 1649  AST 31  ALT 15  ALKPHOS 87  BILITOT 0.9  PROT 6.9  ALBUMIN 3.1*   No results for input(s): LIPASE, AMYLASE in the last 168 hours. No results for input(s): AMMONIA in the last 168 hours. Coagulation  Profile: Recent Labs  Lab 08/30/19 1649 08/31/19 0419  INR 1.1 1.0   Cardiac Enzymes: No results for input(s): CKTOTAL, CKMB, CKMBINDEX, TROPONINI in the last 168 hours. BNP (last 3 results) No results for input(s): PROBNP in the last 8760 hours. HbA1C: No results for input(s): HGBA1C in the last 72 hours. CBG: Recent Labs  Lab 08/31/19 0012  GLUCAP 146*   Lipid Profile: No results for input(s): CHOL, HDL, LDLCALC, TRIG, CHOLHDL, LDLDIRECT in the last 72 hours. Thyroid Function Tests: No results for input(s): TSH, T4TOTAL, FREET4, T3FREE, THYROIDAB in the last 72 hours. Anemia Panel: No results for input(s): VITAMINB12, FOLATE, FERRITIN, TIBC, IRON, RETICCTPCT in the last 72 hours. Sepsis Labs: No results for input(s): PROCALCITON, LATICACIDVEN in the last 168 hours.  Recent Results (from the past 240 hour(s))  SARS CORONAVIRUS 2 (TAT 6-24 HRS)     Status: None   Collection Time: 08/30/19  3:47 PM  Result Value Ref Range Status   SARS Coronavirus 2 NEGATIVE NEGATIVE Final    Comment: (NOTE) SARS-CoV-2 target nucleic acids  are NOT DETECTED. The SARS-CoV-2 RNA is generally detectable in upper and lower respiratory specimens during the acute phase of infection. Negative results do not preclude SARS-CoV-2 infection, do not rule out co-infections with other pathogens, and should not be used as the sole basis for treatment or other patient management decisions. Negative results must be combined with clinical observations, patient history, and epidemiological information. The expected result is Negative. Fact Sheet for Patients: HairSlick.no Fact Sheet for Healthcare Providers: quierodirigir.com This test is not yet approved or cleared by the [place] FDA and  has been authorized for detection and/or diagnosis of SARS-CoV-2 by FDA under an Emergency Use Authorization (EUA). This EUA will remain  in effect (meaning  this test can be used) for the duration of the COVID-19 declaration under Section 56 4(b)(1) of the Act, 21 U.S.C. section 360bbb-3(b)(1), unless the authorization is terminated or revoked sooner. Performed at [facility], [address], [place], [place] [address]   MRSA PCR Screening     Status: None   Collection Time: 08/31/19 12:16 AM   Specimen: Nasal Mucosa; Nasopharyngeal  Result Value Ref Range Status   MRSA by PCR NEGATIVE NEGATIVE Final    Comment:        The GeneXpert MRSA Assay (FDA approved for NASAL specimens only), is one component of a comprehensive MRSA colonization surveillance program. It is not intended to diagnose MRSA infection nor to guide or monitor treatment for MRSA infections. Performed at [facility], [address] [place], [place] [address]          Radiology Studies: DG Tibia/Fibula Right  Result Date: 08/30/2019 CLINICAL DATA:  Pain after fall. Deformity right ankle. EXAM: RIGHT TIBIA AND FIBULA - 2 VIEW COMPARISON:  None. FINDINGS: Ankle fractures are identified. Please see the ankle films for full description. The remainder of the tibia and fibular intact. IMPRESSION: Ankle fractures are identified. These will be described on the dedicated ankle films. The remainder of the tibia and fibular intact. The distal femur is intact. Electronically Signed   By: Gerome Sam III M.D   On: 08/30/2019 14:56   DG Ankle Complete Right  Result Date: 08/30/2019 CLINICAL DATA:  Pain after fall EXAM: RIGHT ANKLE - COMPLETE 3+ VIEW COMPARISON:  None. FINDINGS: A displaced distal fibular fracture is identified. And mildly displaced comminuted medial malleolar fracture is identified. A linear soft tissue calcification is seen anterior to the talar neck, likely displaced fragment from the ankle fractures. The talus itself appears intact. Punctate foci of high attenuation are projected in the soft tissues adjacent to the talus, age indeterminate.  IMPRESSION: 1. Displaced fractures of the distal fibula and medial malleolus. The medial malleolar fracture is comminuted. A linear calcification in the soft tissues anterior to the talus is likely a displaced fracture fragment associated with the ankle fractures. Soft tissue swelling is identified. 2. Punctate foci of high attenuation in the soft tissues adjacent to the talus are consistent with foreign bodies, age indeterminate. Electronically Signed   By: Gerome Sam III M.D   On: 08/30/2019 14:58   CT Angio Chest PE W and/or Wo Contrast  Result Date: 08/30/2019 CLINICAL DATA:  Shortness of breath chest pain. EXAM: CT ANGIOGRAPHY CHEST WITH CONTRAST TECHNIQUE: Multidetector CT imaging of the chest was performed using the standard protocol during bolus administration of intravenous contrast. Multiplanar CT image reconstructions and MIPs were obtained to evaluate the vascular anatomy. CONTRAST:  19mL OMNIPAQUE IOHEXOL 350 MG/ML SOLN COMPARISON:  None. FINDINGS: Cardiovascular: Contrast injection is sufficient to demonstrate satisfactory opacification of the pulmonary arteries to the segmental level. There is no pulmonary embolus. The main pulmonary artery is significantly dilated measuring approximately 3.9 cm in diameter. There is no CT evidence of acute right heart strain. The patient is status post aortic stent graft placement involving the descending thoracic aorta. The stent graft appears to be grossly patent. At the level of the distal descending thoracic aorta, there is a questionable filling defect (axial series 5, image 235). There is a partially thrombosed false lumen with the aorta at this level measuring up to approximately 5.3 cm in diameter on the coronal series (series 7, image 49). Heart size is enlarged. Coronary artery calcifications are noted. Thoracic aortic calcifications are noted. Mediastinum/Nodes: --No mediastinal or hilar lymphadenopathy. There are few slightly prominent distal  paraesophageal lymph nodes unknown clinical significance. --No axillary lymphadenopathy. --No supraclavicular lymphadenopathy. --Normal thyroid gland. --The esophagus is unremarkable Lungs/Pleura: Emphysematous changes are noted bilaterally. There is atelectasis at the right lung base. There is a small amount of atelectasis at the left lung base. There is complete collapse of the right middle lobe. There is a probable filling defect within the proximal right middle lobe bronchus. The trachea is otherwise unremarkable. There is some mild bilateral bronchial wall thickening and mucus plugging at the lung bases. Upper Abdomen: No acute abnormality. Musculoskeletal: No chest wall abnormality. No acute or significant osseous findings. Review of the MIP images confirms the above findings. IMPRESSION: 1. No evidence for an acute pulmonary embolism. 2. The patient is status post prior placement of a thoracic aortic endograft, perhaps related to a prior traumatic injury or dissection. The graft is grossly patent. 3. Intraluminal filling defect at the level of the distal descending thoracic aorta. This is felt to represent mixing artifact from the adjacent partially thrombosed false lumen. Intraluminal thrombus is difficult to entirely exclude on this study secondary to contrast timing. 4. Aneurysmal descending thoracic aorta measuring up to approximately 5.3 cm. 5. Complete collapse of the right middle lobe. There is a probable filling defect within the proximal right middle lobe bronchus (favored to represent secretions or debris). Consider endobronchial evaluation as clinically indicated. 6. There is some mucous plugging and bronchial wall thickening at the lung bases bilaterally suggestive of reactive or infectious bronchiolitis. 7. Dilated main pulmonary artery which can be seen in patients with elevated pulmonary artery pressures. 8. Cardiomegaly. 9. Aortic Atherosclerosis (ICD10-I70.0) and Emphysema (ICD10-J43.9).  Electronically Signed   By: Katherine Mantlehristopher  Green M.D.   On: 08/30/2019 19:33   DG Chest Portable 1 View  Result Date: 08/30/2019 CLINICAL DATA:  Fall.  Difficulty breathing. EXAM: PORTABLE CHEST 1 VIEW COMPARISON:  July 26, 2012 FINDINGS: No pneumothorax. A left-sided rib fractures unchanged since 2013, nonacute. Cardiomediastinal silhouette is normal. Mild haziness over the right base is favored to be due to patient rotation. No suspicious infiltrates. A stent is seen in the descending thoracic aorta, new since 2013. No other acute abnormalities. IMPRESSION: No active disease. Electronically Signed   By: Gerome Samavid  Williams III M.D   On: 08/30/2019 13:59        Scheduled Meds: . acetylcysteine  2 mL Nebulization QID  . amLODipine  10 mg Oral Daily  . atorvastatin  40 mg Oral QHS  . carvedilol  25 mg Oral BID  . Chlorhexidine Gluconate Cloth  6 each Topical Daily  . docusate sodium  100 mg Oral BID  . doxycycline  100 mg Oral  Q12H  . guaiFENesin  600 mg Oral BID  . heparin  5,000 Units Subcutaneous Q8H  . hydrochlorothiazide  12.5 mg Oral Daily  . ipratropium-albuterol  3 mL Nebulization Q4H  . lactobacillus acidophilus  2 tablet Oral BID  . loratadine  10 mg Oral Daily  . losartan  100 mg Oral Daily  . mometasone-formoterol  2 puff Inhalation BID  . predniSONE  40 mg Oral Q breakfast  . senna  1 tablet Oral BID  . sodium chloride flush  3 mL Intravenous Q12H   Continuous Infusions: . sodium chloride Stopped (09/01/19 0012)  .  ceFAZolin (ANCEF) IV    . cefTRIAXone (ROCEPHIN)  IV Stopped (08/31/19 2106)  . methocarbamol (ROBAXIN) IV       LOS: 2 days    Time spent: 30 minutes    Ezekiel Slocumb, DO [facility]   If 7PM-7AM, please contact night-coverage [email or web address] 09/01/2019, 7:35 AM

## 2019-09-01 NOTE — Progress Notes (Signed)
Subjective:  Patient noted to have EKG changes.  Echocardiogram ordered by the cardiologist.  Patient denies any change to her right ankle.  Pain is controlled currently.  Objective:   VITALS:   Vitals:   08/31/19 2000 08/31/19 2333 09/01/19 0421 09/01/19 0811  BP:  140/65  (!) 158/70  Pulse:  72  (!) 59  Resp:  19  16  Temp:  98.2 F (36.8 C)  97.7 F (36.5 C)  TempSrc:  Oral  Oral  SpO2: 92% 96% 96% 94%  Weight:      Height:        PHYSICAL EXAM: Right lower extremity: Patient's splint remains intact and clean.  Patient can flex and extend her toes and has intact sensation light touch in her toes well-perfused.   LABS  Results for orders placed or performed during the hospital encounter of 08/30/19 (from the past 24 hour(s))  CBC with Differential/Platelet     Status: Abnormal   Collection Time: 09/01/19  5:03 AM  Result Value Ref Range   WBC 7.1 4.0 - 10.5 K/uL   RBC 4.50 3.87 - 5.11 MIL/uL   Hemoglobin 11.7 (L) 12.0 - 15.0 g/dL   HCT 36.6 44.0 - 34.7 %   MCV 82.9 80.0 - 100.0 fL   MCH 26.0 26.0 - 34.0 pg   MCHC 31.4 30.0 - 36.0 g/dL   RDW 42.5 (H) 95.6 - 38.7 %   Platelets 224 150 - 400 K/uL   nRBC 0.0 0.0 - 0.2 %   Neutrophils Relative % 72 %   Neutro Abs 5.1 1.7 - 7.7 K/uL   Lymphocytes Relative 18 %   Lymphs Abs 1.3 0.7 - 4.0 K/uL   Monocytes Relative 9 %   Monocytes Absolute 0.6 0.1 - 1.0 K/uL   Eosinophils Relative 0 %   Eosinophils Absolute 0.0 0.0 - 0.5 K/uL   Basophils Relative 0 %   Basophils Absolute 0.0 0.0 - 0.1 K/uL   Immature Granulocytes 1 %   Abs Immature Granulocytes 0.05 0.00 - 0.07 K/uL  Basic metabolic panel     Status: Abnormal   Collection Time: 09/01/19  5:03 AM  Result Value Ref Range   Sodium 138 135 - 145 mmol/L   Potassium 4.3 3.5 - 5.1 mmol/L   Chloride 103 98 - 111 mmol/L   CO2 26 22 - 32 mmol/L   Glucose, Bld 167 (H) 70 - 99 mg/dL   BUN 33 (H) 8 - 23 mg/dL   Creatinine, Ser 5.64 (H) 0.44 - 1.00 mg/dL   Calcium 8.9 8.9  - 33.2 mg/dL   GFR calc non Af Amer 59 (L) >60 mL/min   GFR calc Af Amer >60 >60 mL/min   Anion gap 9 5 - 15  Magnesium     Status: None   Collection Time: 09/01/19  5:03 AM  Result Value Ref Range   Magnesium 2.1 1.7 - 2.4 mg/dL    DG Tibia/Fibula Right  Result Date: 08/30/2019 CLINICAL DATA:  Pain after fall. Deformity right ankle. EXAM: RIGHT TIBIA AND FIBULA - 2 VIEW COMPARISON:  None. FINDINGS: Ankle fractures are identified. Please see the ankle films for full description. The remainder of the tibia and fibular intact. IMPRESSION: Ankle fractures are identified. These will be described on the dedicated ankle films. The remainder of the tibia and fibular intact. The distal femur is intact. Electronically Signed   By: Gerome Sam III M.D   On: 08/30/2019 14:56   DG Ankle Complete  Right  Result Date: 08/30/2019 CLINICAL DATA:  Pain after fall EXAM: RIGHT ANKLE - COMPLETE 3+ VIEW COMPARISON:  None. FINDINGS: A displaced distal fibular fracture is identified. And mildly displaced comminuted medial malleolar fracture is identified. A linear soft tissue calcification is seen anterior to the talar neck, likely displaced fragment from the ankle fractures. The talus itself appears intact. Punctate foci of high attenuation are projected in the soft tissues adjacent to the talus, age indeterminate. IMPRESSION: 1. Displaced fractures of the distal fibula and medial malleolus. The medial malleolar fracture is comminuted. A linear calcification in the soft tissues anterior to the talus is likely a displaced fracture fragment associated with the ankle fractures. Soft tissue swelling is identified. 2. Punctate foci of high attenuation in the soft tissues adjacent to the talus are consistent with foreign bodies, age indeterminate. Electronically Signed   By: Dorise Bullion III M.D   On: 08/30/2019 14:58   CT Angio Chest PE W and/or Wo Contrast  Result Date: 08/30/2019 CLINICAL DATA:  Shortness of breath  chest pain. EXAM: CT ANGIOGRAPHY CHEST WITH CONTRAST TECHNIQUE: Multidetector CT imaging of the chest was performed using the standard protocol during bolus administration of intravenous contrast. Multiplanar CT image reconstructions and MIPs were obtained to evaluate the vascular anatomy. CONTRAST:  72mL OMNIPAQUE IOHEXOL 350 MG/ML SOLN COMPARISON:  None. FINDINGS: Cardiovascular: Contrast injection is sufficient to demonstrate satisfactory opacification of the pulmonary arteries to the segmental level. There is no pulmonary embolus. The main pulmonary artery is significantly dilated measuring approximately 3.9 cm in diameter. There is no CT evidence of acute right heart strain. The patient is status post aortic stent graft placement involving the descending thoracic aorta. The stent graft appears to be grossly patent. At the level of the distal descending thoracic aorta, there is a questionable filling defect (axial series 5, image 235). There is a partially thrombosed false lumen with the aorta at this level measuring up to approximately 5.3 cm in diameter on the coronal series (series 7, image 49). Heart size is enlarged. Coronary artery calcifications are noted. Thoracic aortic calcifications are noted. Mediastinum/Nodes: --No mediastinal or hilar lymphadenopathy. There are few slightly prominent distal paraesophageal lymph nodes unknown clinical significance. --No axillary lymphadenopathy. --No supraclavicular lymphadenopathy. --Normal thyroid gland. --The esophagus is unremarkable Lungs/Pleura: Emphysematous changes are noted bilaterally. There is atelectasis at the right lung base. There is a small amount of atelectasis at the left lung base. There is complete collapse of the right middle lobe. There is a probable filling defect within the proximal right middle lobe bronchus. The trachea is otherwise unremarkable. There is some mild bilateral bronchial wall thickening and mucus plugging at the lung bases.  Upper Abdomen: No acute abnormality. Musculoskeletal: No chest wall abnormality. No acute or significant osseous findings. Review of the MIP images confirms the above findings. IMPRESSION: 1. No evidence for an acute pulmonary embolism. 2. The patient is status post prior placement of a thoracic aortic endograft, perhaps related to a prior traumatic injury or dissection. The graft is grossly patent. 3. Intraluminal filling defect at the level of the distal descending thoracic aorta. This is felt to represent mixing artifact from the adjacent partially thrombosed false lumen. Intraluminal thrombus is difficult to entirely exclude on this study secondary to contrast timing. 4. Aneurysmal descending thoracic aorta measuring up to approximately 5.3 cm. 5. Complete collapse of the right middle lobe. There is a probable filling defect within the proximal right middle lobe bronchus (favored to represent secretions  or debris). Consider endobronchial evaluation as clinically indicated. 6. There is some mucous plugging and bronchial wall thickening at the lung bases bilaterally suggestive of reactive or infectious bronchiolitis. 7. Dilated main pulmonary artery which can be seen in patients with elevated pulmonary artery pressures. 8. Cardiomegaly. 9. Aortic Atherosclerosis (ICD10-I70.0) and Emphysema (ICD10-J43.9). Electronically Signed   By: Katherine Mantle M.D.   On: 08/30/2019 19:33   DG Chest Portable 1 View  Result Date: 08/30/2019 CLINICAL DATA:  Fall.  Difficulty breathing. EXAM: PORTABLE CHEST 1 VIEW COMPARISON:  July 26, 2012 FINDINGS: No pneumothorax. A left-sided rib fractures unchanged since 2013, nonacute. Cardiomediastinal silhouette is normal. Mild haziness over the right base is favored to be due to patient rotation. No suspicious infiltrates. A stent is seen in the descending thoracic aorta, new since 2013. No other acute abnormalities. IMPRESSION: No active disease. Electronically Signed   By:  Gerome Sam III M.D   On: 08/30/2019 13:59    Assessment/Plan: Day of Surgery   Principal Problem:   Bimalleolar ankle fracture, right, closed, initial encounter Active Problems:   HTN (hypertension), benign   COPD with acute exacerbation (HCC)   Acute respiratory failure with hypoxia (HCC)   Aneurysm of thoracic aorta [facility])  Patient awaiting echocardiogram results to determine whether ORIF of the right ankle to be performed today.  Patient should remain n.p.o. until decision regarding surgery is made.    Juanell Fairly , MD 09/01/2019, 1:23 PM

## 2019-09-01 NOTE — Transfer of Care (Signed)
Immediate Anesthesia Transfer of Care Note  Patient: Jamie Roman  Procedure(s) Performed: OPEN REDUCTION INTERNAL FIXATION (ORIF) ANKLE FRACTURE (Right Ankle)  Patient Location: PACU  Anesthesia Type:Spinal  Level of Consciousness: awake and oriented  Airway & Oxygen Therapy: Patient Spontanous Breathing and Patient connected to face mask oxygen  Post-op Assessment: Report given to RN and Post -op Vital signs reviewed and stable  Post vital signs: Reviewed and stable  Last Vitals:  Vitals Value Taken Time  BP    Temp    Pulse    Resp    SpO2      Last Pain:  Vitals:   09/01/19 1458  TempSrc: Tympanic  PainSc: 0-No pain         Complications: No apparent anesthesia complications

## 2019-09-01 NOTE — Progress Notes (Signed)
Spoke with On call for Cardiology the physician is in Emergency Cath lab, will call when it is complete, Updated Dr. Henrene Hawking by telephone

## 2019-09-01 NOTE — Anesthesia Preprocedure Evaluation (Addendum)
Anesthesia Evaluation  Patient identified by MRN, date of birth, ID band Patient awake    Reviewed: Allergy & Precautions, NPO status , Patient's Chart, lab work & pertinent test results  Airway Mallampati: III       Dental   Pulmonary COPD,  COPD inhaler, Current Smoker,           Cardiovascular hypertension, Pt. on medications + Peripheral Vascular Disease       Neuro/Psych  Headaches, negative psych ROS   GI/Hepatic (+)     substance abuse  alcohol use, Hx of obstruction   Endo/Other  negative endocrine ROS  Renal/GU negative Renal ROS  negative genitourinary   Musculoskeletal negative musculoskeletal ROS (+)   Abdominal   Peds negative pediatric ROS (+)  Hematology negative hematology ROS (+)   Anesthesia Other Findings   Reproductive/Obstetrics                             Anesthesia Physical Anesthesia Plan  ASA: III  Anesthesia Plan: Spinal   Post-op Pain Management:    Induction: Intravenous  PONV Risk Score and Plan:   Airway Management Planned: Nasal Cannula  Additional Equipment:   Intra-op Plan:   Post-operative Plan:   Informed Consent: I have reviewed the patients History and Physical, chart, labs and discussed the procedure including the risks, benefits and alternatives for the proposed anesthesia with the patient or authorized representative who has indicated his/her understanding and acceptance.     Dental advisory given  Plan Discussed with: CRNA and Surgeon  Anesthesia Plan Comments: (Talked with Dr. Okey Dupre, the echo shows mild AS and moderate AI.Marland Kitchen No wall motion abnormalities and with an EF of 60%.   Ok for spinal.)       Anesthesia Quick Evaluation

## 2019-09-01 NOTE — Anesthesia Procedure Notes (Signed)
Date/Time: 09/01/2019 3:21 PM Performed by: Junious Silk, CRNA Pre-anesthesia Checklist: Patient identified, Emergency Drugs available, Suction available, Patient being monitored and Timeout performed Oxygen Delivery Method: Simple face mask

## 2019-09-01 NOTE — Consult Note (Addendum)
Cardiology Consultation:   Patient ID: Jamie Roman MRN: [id number]; DOB: Jun 20, 1961  Admit date: 08/30/2019 Date of Consult: 09/01/2019  Primary Care Provider: [facility] Primary Cardiologist: New - Temika Sutphin Primary Electrophysiologist:  None    Patient Profile:   Jamie Roman is a 59 y.o. 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 Denton Lank.  History of Present Illness:   Jamie Roman suffered a mechanical fall 2 days ago.  She believes that she may have missed a step and subsequently fractured her ankle.  She was unable to bear weight and came to the emergency department where she was diagnosed with a right ankle fracture involving the distal fibula and medial malleolus.  She was also found to be hypoxic.  No PE was identified but complete collapse of the right middle lobe was noted with suspected debris within the right middle lobe bronchus.  Aneurysmal descending thoracic aorta noted as well as evidence of prior endovascular repair.  She was admitted to stepdown overnight due to hypoxia and right middle lobe collapse.  She was treated with Mucomyst as well as duo nebs and chest PT.  She was transferred to the floor this morning.  Jamie Roman denies having had any chest pain, shortness of breath, palpitations, lightheadedness, edema, and syncope.  She makes note of a heart murmur but does not recall specifics or prior evaluation of this.  Echo in 2013 showed normal LVEF with mild to moderate aortic regurgitation.  EKG obtained yesterday showed subtle inferior ST segment changes; further work-up was requested per anesthesiology to exclude ischemic heart disease.  Jamie Roman's only complaint at this time is of  intermittent pain involving the right ankle, which is splinted.  Heart Pathway Score:     Past Medical History:  Diagnosis Date  . AAA (abdominal aortic aneurysm) (HCC)   . Bowel obstruction (HCC)   . COPD (chronic obstructive pulmonary disease) (HCC)   . Headache(784.0)   . Hypertension     Past Surgical History:  Procedure Laterality Date  . ABDOMINAL AORTIC ANEURYSM REPAIR    . COLOSTOMY    . COLOSTOMY REVERSAL       Home Medications:  Prior to Admission medications   Medication Sig Start Date Madason Rauls Date Taking? Authorizing Provider  amLODipine (NORVASC) 10 MG tablet Take 1 tablet (10 mg total) by mouth daily. 01/05/12 08/30/19 Yes Viyuoh, Adeline C, MD  ASPIRIN LOW DOSE 81 MG EC tablet Take 81 mg by mouth daily. 08/10/19  Yes [provider]  atorvastatin (LIPITOR) 40 MG tablet Take 40 mg by mouth at bedtime. 08/10/19  Yes [provider]  carvedilol (COREG) 25 MG tablet Take 25 mg by mouth 2 (two) times daily. 07/29/19  Yes [provider]  hydrochlorothiazide (MICROZIDE) 12.5 MG capsule Take 12.5 mg by mouth daily. 08/06/19  Yes [provider]  loratadine (CLARITIN) 10 MG tablet Take 10 mg by mouth daily. 06/13/19  Yes [provider]  losartan (COZAAR) 100 MG tablet Take 100 mg by mouth daily. 06/13/19  Yes [provider]  SYMBICORT 80-4.5 MCG/ACT inhaler Inhale 2 puffs into the lungs 2 (two) times daily. 08/21/19  Yes [provider]    Inpatient Medications: Scheduled Meds: . acetylcysteine  2 mL  Nebulization QID  . acidophilus  2 capsule Oral BID  . amLODipine  10 mg Oral Daily  . atorvastatin  40 mg Oral QHS  . carvedilol  25 mg Oral BID  . Chlorhexidine Gluconate Cloth  6 each Topical Daily  . docusate sodium  100 mg Oral BID  . doxycycline  100 mg Oral Q12H  . guaiFENesin  600 mg Oral BID  . heparin  5,000 Units Subcutaneous Q8H  . hydrochlorothiazide  12.5 mg Oral Daily  . ipratropium-albuterol  3 mL  Nebulization Q4H  . loratadine  10 mg Oral Daily  . losartan  100 mg Oral Daily  . mometasone-formoterol  2 puff Inhalation BID  . predniSONE  40 mg Oral Q breakfast  . senna  1 tablet Oral BID  . sodium chloride flush  3 mL Intravenous Q12H   Continuous Infusions: . sodium chloride Stopped (09/01/19 0012)  .  ceFAZolin (ANCEF) IV    . cefTRIAXone (ROCEPHIN)  IV Stopped (08/31/19 2106)  . methocarbamol (ROBAXIN) IV     PRN Meds: sodium chloride, acetaminophen **OR** acetaminophen, bisacodyl, lip balm, magnesium hydroxide, methocarbamol **OR** methocarbamol (ROBAXIN) IV, morphine injection, ondansetron **OR** ondansetron (ZOFRAN) IV, oxyCODONE, sodium phosphate  Allergies:   No Known Allergies  Social History:   Social History   Tobacco Use  . Smoking status: Current Every Day Smoker    Packs/day: 0.50    Years: 15.00    Pack years: 7.50    Types: Cigarettes  . Smokeless tobacco: Never Used  Substance Use Topics  . Alcohol use: Yes    Comment: 1 quart  . Drug use: Not on file    Family History:   History reviewed. No pertinent family history.   ROS:  Please see the history of present illness. All other ROS reviewed and negative.     Physical Exam/Data:   Vitals:   08/31/19 2000 08/31/19 2333 09/01/19 0421 09/01/19 0811  BP:  140/65  (!) 158/70  Pulse:  72  (!) 59  Resp:  19  16  Temp:  98.2 F (36.8 C)  97.7 F (36.5 C)  TempSrc:  Oral  Oral  SpO2: 92% 96% 96% 94%  Weight:      Height:        Intake/Output Summary (Last 24 hours) at 09/01/2019 1228 Last data filed at 09/01/2019 0600 Gross per 24 hour  Intake 373.15 ml  Output 1150 ml  Net -776.85 ml   Last 3 Weights 08/31/2019 08/30/2019 01/04/2012  Weight (lbs) 160 lb 4.4 oz 150 lb 156 lb 12 oz  Weight (kg) 72.7 kg 68.04 kg 71.1 kg     Body mass index is 29.12 kg/m.  General:  Well nourished, well developed, in no acute distress HEENT: normal Lymph: no adenopathy Neck: no JVD or HJR or HJR Endocrine:   No thryomegaly Vascular: No carotid bruits; 2+ radial pulses bilaterally. Cardiac: Regular rate and rhythm with 3/6 systolic and 2/6 early diastolic murmurs. Lungs: Diffusely diminished breath sounds with faint inspiratory wheezes. Abd: soft, nontender, no hepatomegaly  Ext: no edema.  Right calf/foot covered with splint. Musculoskeletal: Right calf/foot covered with splint. Skin: warm and dry  Neuro:  CNs 2-12 intact, no focal abnormalities noted Psych:  Normal affect   EKG: EKG obtained today demonstrates normal sinus rhythm with LVH and nonspecific ST segment changes.  Tracing obtained yesterday was personally reviewed and also shows sinus rhythm with nonspecific inferior ST segment changes.  It does not meet STEMI criteria.  Telemetry:  Telemetry was personally reviewed and demonstrates: Normal sinus rhythm without significant abnormality.  Relevant CV Studies: TTE (01/03/2012): Normal LV size with moderate LVH.  LVEF 55-60% with normal wall motion.  Mild to moderate aortic regurgitation.  Mild to moderate left atrial enlargement.  Normal RV size and function.  Laboratory Data:  High Sensitivity Troponin:   Recent Labs  Lab 08/30/19 1649  TROPONINIHS 17     Chemistry Recent Labs  Lab 08/30/19 1649 08/31/19 0419 09/01/19 0503  NA 135 133* 138  K 3.6 4.1 4.3  CL 98 98 103  CO2 26 29 26   GLUCOSE 76 142* 167*  BUN 33* 36* 33*  CREATININE 1.42* 1.28* 1.02*  CALCIUM 8.6* 8.5* 8.9  GFRNONAA 40* 45* 59*  GFRAA 46* 53* >60  ANIONGAP 11 6 9     Recent Labs  Lab 08/30/19 1649  PROT 6.9  ALBUMIN 3.1*  AST 31  ALT 15  ALKPHOS 87  BILITOT 0.9   Hematology Recent Labs  Lab 08/30/19 1649 08/31/19 0419 09/01/19 0503  WBC 9.3 5.0 7.1  RBC 4.71 4.94 4.50  HGB 12.3 12.8 11.7*  HCT 39.0 41.2 37.3  MCV 82.8 83.4 82.9  MCH 26.1 25.9* 26.0  MCHC 31.5 31.1 31.4  RDW 16.4* 16.3* 16.2*  PLT 215 209 224   BNP Recent Labs  Lab 08/30/19 1649  BNP 75.0    DDimer No  results for input(s): DDIMER in the last 168 hours.   Radiology/Studies:  DG Tibia/Fibula Right  Result Date: 08/30/2019 CLINICAL DATA:  Pain after fall. Deformity right ankle. EXAM: RIGHT TIBIA AND FIBULA - 2 VIEW COMPARISON:  None. FINDINGS: Ankle fractures are identified. Please see the ankle films for full description. The remainder of the tibia and fibular intact. IMPRESSION: Ankle fractures are identified. These will be described on the dedicated ankle films. The remainder of the tibia and fibular intact. The distal femur is intact. Electronically Signed   By: 09/01/19 III M.D   On: 08/30/2019 14:56   DG Ankle Complete Right  Result Date: 08/30/2019 CLINICAL DATA:  Pain after fall EXAM: RIGHT ANKLE - COMPLETE 3+ VIEW COMPARISON:  None. FINDINGS: A displaced distal fibular fracture is identified. And mildly displaced comminuted medial malleolar fracture is identified. A linear soft tissue calcification is seen anterior to the talar neck, likely displaced fragment from the ankle fractures. The talus itself appears intact. Punctate foci of high attenuation are projected in the soft tissues adjacent to the talus, age indeterminate. IMPRESSION: 1. Displaced fractures of the distal fibula and medial malleolus. The medial malleolar fracture is comminuted. A linear calcification in the soft tissues anterior to the talus is likely a displaced fracture fragment associated with the ankle fractures. Soft tissue swelling is identified. 2. Punctate foci of high attenuation in the soft tissues adjacent to the talus are consistent with foreign bodies, age indeterminate. Electronically Signed   By: 09/01/2019 III M.D   On: 08/30/2019 14:58   CT Angio Chest PE W and/or Wo Contrast  Result Date: 08/30/2019 CLINICAL DATA:  Shortness of breath chest pain. EXAM: CT ANGIOGRAPHY CHEST WITH CONTRAST TECHNIQUE: Multidetector CT imaging of the chest was performed using the standard protocol during bolus  administration of intravenous contrast. Multiplanar CT image reconstructions and MIPs were obtained to evaluate the vascular anatomy. CONTRAST:  17mL OMNIPAQUE IOHEXOL 350 MG/ML SOLN COMPARISON:  None. FINDINGS: Cardiovascular: Contrast injection is sufficient to demonstrate satisfactory opacification of the pulmonary arteries to the segmental level.  There is no pulmonary embolus. The main pulmonary artery is significantly dilated measuring approximately 3.9 cm in diameter. There is no CT evidence of acute right heart strain. The patient is status post aortic stent graft placement involving the descending thoracic aorta. The stent graft appears to be grossly patent. At the level of the distal descending thoracic aorta, there is a questionable filling defect (axial series 5, image 235). There is a partially thrombosed false lumen with the aorta at this level measuring up to approximately 5.3 cm in diameter on the coronal series (series 7, image 49). Heart size is enlarged. Coronary artery calcifications are noted. Thoracic aortic calcifications are noted. Mediastinum/Nodes: --No mediastinal or hilar lymphadenopathy. There are few slightly prominent distal paraesophageal lymph nodes unknown clinical significance. --No axillary lymphadenopathy. --No supraclavicular lymphadenopathy. --Normal thyroid gland. --The esophagus is unremarkable Lungs/Pleura: Emphysematous changes are noted bilaterally. There is atelectasis at the right lung base. There is a small amount of atelectasis at the left lung base. There is complete collapse of the right middle lobe. There is a probable filling defect within the proximal right middle lobe bronchus. The trachea is otherwise unremarkable. There is some mild bilateral bronchial wall thickening and mucus plugging at the lung bases. Upper Abdomen: No acute abnormality. Musculoskeletal: No chest wall abnormality. No acute or significant osseous findings. Review of the MIP images confirms  the above findings. IMPRESSION: 1. No evidence for an acute pulmonary embolism. 2. The patient is status post prior placement of a thoracic aortic endograft, perhaps related to a prior traumatic injury or dissection. The graft is grossly patent. 3. Intraluminal filling defect at the level of the distal descending thoracic aorta. This is felt to represent mixing artifact from the adjacent partially thrombosed false lumen. Intraluminal thrombus is difficult to entirely exclude on this study secondary to contrast timing. 4. Aneurysmal descending thoracic aorta measuring up to approximately 5.3 cm. 5. Complete collapse of the right middle lobe. There is a probable filling defect within the proximal right middle lobe bronchus (favored to represent secretions or debris). Consider endobronchial evaluation as clinically indicated. 6. There is some mucous plugging and bronchial wall thickening at the lung bases bilaterally suggestive of reactive or infectious bronchiolitis. 7. Dilated main pulmonary artery which can be seen in patients with elevated pulmonary artery pressures. 8. Cardiomegaly. 9. Aortic Atherosclerosis (ICD10-I70.0) and Emphysema (ICD10-J43.9). Electronically Signed   By: Katherine Mantlehristopher  Green M.D.   On: 08/30/2019 19:33   DG Chest Portable 1 View  Result Date: 08/30/2019 CLINICAL DATA:  Fall.  Difficulty breathing. EXAM: PORTABLE CHEST 1 VIEW COMPARISON:  July 26, 2012 FINDINGS: No pneumothorax. A left-sided rib fractures unchanged since 2013, nonacute. Cardiomediastinal silhouette is normal. Mild haziness over the right base is favored to be due to patient rotation. No suspicious infiltrates. A stent is seen in the descending thoracic aorta, new since 2013. No other acute abnormalities. IMPRESSION: No active disease. Electronically Signed   By: Gerome Samavid  Williams III M.D   On: 08/30/2019 13:59    Assessment and Plan:   Abnormal EKG and preoperative cardiovascular risk assessment: Yesterday's EKG  demonstrates LVH with subtle inferior segment ST elevations that do not meet STEMI criteria.  Repeat EKG today shows less impressive inferior ST segment changes that are most consistent with abnormal repolarization.  The patient does not have any signs or symptoms of acute coronary syndrome, though she has multiple risk factors including her history of extensive vascular disease.  She also has prominent systolic and early diastolic  murmurs with echocardiogram in 2013 showing mild to moderate aortic regurgitation.  I recommend obtaining a transthoracic echocardiogram today (order has been placed and will be performed shortly).  If this does not show any critical structural abnormalities, I think it is reasonable for Jamie Roman to proceed with elective surgery without additional cardiac testing or intervention.  Peripheral vascular disease: Patient with known thoracoabdominal aortic disease status post TEVAR.  Continue secondary prevention and follow-up with vascular surgery.  Acute on chronic respiratory failure with hypoxia: Likely related to COPD and possible mucous plugging leading to right middle lobe collapse.  We will obtain echocardiogram to ensure there is not a cardiac component to her dyspnea, though exam is most consistent with primary pulmonary pathology.  Ongoing management per internal medicine and pulmonary.  Addendum: (09/01/19 @ 2:20 PM): Transthoracic echocardiogram has been reviewed, demonstrating normal biventricular systolic function.  There is moderate LVH as well as mild aortic stenosis and moderate aortic regurgitation.  There are no findings that would preclude moving forward with elective surgery at this time, given that the patient has been hemodynamically stable and asymptomatic.  She can follow-up with [place] in as an outpatient in 4 to 6 weeks for ongoing management of her valvular heart disease.  For questions or updates, please contact [facility] Please consult  [email or web address] for contact info under [facility].  Signed, Yvonne Kendall, MD  09/01/2019 12:28 PM

## 2019-09-02 ENCOUNTER — Inpatient Hospital Stay: Payer: Medicaid Other

## 2019-09-02 LAB — BASIC METABOLIC PANEL
Anion gap: 8 (ref 5–15)
BUN: 24 mg/dL — ABNORMAL HIGH (ref 8–23)
CO2: 29 mmol/L (ref 22–32)
Calcium: 8.8 mg/dL — ABNORMAL LOW (ref 8.9–10.3)
Chloride: 103 mmol/L (ref 98–111)
Creatinine, Ser: 1.03 mg/dL — ABNORMAL HIGH (ref 0.44–1.00)
GFR calc Af Amer: >60 mL/min (ref >60)
GFR calc non Af Amer: 59 mL/min — ABNORMAL LOW (ref >60)
Glucose, Bld: 91 mg/dL (ref 70–99)
Potassium: 4.3 mmol/L (ref 3.5–5.1)
Sodium: 140 mmol/L (ref 135–145)

## 2019-09-02 LAB — CBC
HCT: 38.8 % (ref 36.0–46.0)
Hemoglobin: 11.8 g/dL — ABNORMAL LOW (ref 12.0–15.0)
MCH: 26 pg (ref 26.0–34.0)
MCHC: 30.4 g/dL (ref 30.0–36.0)
MCV: 85.7 fL (ref 80.0–100.0)
Platelets: 228 10*3/uL (ref 150–400)
RBC: 4.53 MIL/uL (ref 3.87–5.11)
RDW: 16.8 % — ABNORMAL HIGH (ref 11.5–15.5)
WBC: 7.5 10*3/uL (ref 4.0–10.5)
nRBC: 0 % (ref 0.0–0.2)

## 2019-09-02 LAB — MAGNESIUM: Magnesium: 2.6 mg/dL — ABNORMAL HIGH (ref 1.7–2.4)

## 2019-09-02 MED ORDER — PANTOPRAZOLE SODIUM 20 MG PO TBEC
20.0000 mg | DELAYED_RELEASE_TABLET | Freq: Every day | ORAL | Status: DC
  Administered 2019-09-03: 20 mg via ORAL
  Filled 2019-09-02 (×3): qty 1

## 2019-09-02 MED ORDER — IPRATROPIUM-ALBUTEROL 0.5-2.5 (3) MG/3ML IN SOLN
3.0000 mL | Freq: Three times a day (TID) | RESPIRATORY_TRACT | Status: DC
  Administered 2019-09-02 – 2019-09-04 (×7): 3 mL via RESPIRATORY_TRACT
  Filled 2019-09-02 (×7): qty 3

## 2019-09-02 MED ORDER — ALBUTEROL SULFATE (2.5 MG/3ML) 0.083% IN NEBU: 2.5000 mg | INHALATION_SOLUTION | RESPIRATORY_TRACT | Status: DC | PRN

## 2019-09-02 NOTE — Progress Notes (Signed)
Subjective:  POD #1 s/p ORIF of right bimalleolar ankle fracture.   Patient reports right ankle pain as mild.    Objective:   VITALS:   Vitals:   09/02/19 0727 09/02/19 0755 09/02/19 0847 09/02/19 1141  BP:  (!) 122/59 121/68 125/69  Pulse: 63 60 61 71  Resp: 16 12    Temp:  97.9 F (36.6 C) (!) 97.4 F (36.3 C) 97.9 F (36.6 C)  TempSrc:  Oral Oral Oral  SpO2: 92% 97% 94% 94%  Weight:      Height:        PHYSICAL EXAM: Right lower extremity: Splint and dressing are clean dry and intact.  Patient's toes are well-perfused.  She has intact sensation light touch and can flex and extend her toes. Neurovascular intact Sensation intact distally Compartment soft  LABS  Results for orders placed or performed during the hospital encounter of 08/30/19 (from the past 24 hour(s))  CBC     Status: Abnormal   Collection Time: 09/02/19  6:05 AM  Result Value Ref Range   WBC 7.5 4.0 - 10.5 K/uL   RBC 4.53 3.87 - 5.11 MIL/uL   Hemoglobin 11.8 (L) 12.0 - 15.0 g/dL   HCT 38.8 36.0 - 46.0 %   MCV 85.7 80.0 - 100.0 fL   MCH 26.0 26.0 - 34.0 pg   MCHC 30.4 30.0 - 36.0 g/dL   RDW 16.8 (H) 11.5 - 15.5 %   Platelets 228 150 - 400 K/uL   nRBC 0.0 0.0 - 0.2 %  Basic metabolic panel     Status: Abnormal   Collection Time: 09/02/19  6:05 AM  Result Value Ref Range   Sodium 140 135 - 145 mmol/L   Potassium 4.3 3.5 - 5.1 mmol/L   Chloride 103 98 - 111 mmol/L   CO2 29 22 - 32 mmol/L   Glucose, Bld 91 70 - 99 mg/dL   BUN 24 (H) 8 - 23 mg/dL   Creatinine, Ser 1.03 (H) 0.44 - 1.00 mg/dL   Calcium 8.8 (L) 8.9 - 10.3 mg/dL   GFR calc non Af Amer 59 (L) >60 mL/min   GFR calc Af Amer >60 >60 mL/min   Anion gap 8 5 - 15  Magnesium     Status: Abnormal   Collection Time: 09/02/19  6:05 AM  Result Value Ref Range   Magnesium 2.6 (H) 1.7 - 2.4 mg/dL    DG Ankle Right Port  Result Date: 09/01/2019 CLINICAL DATA:  59 year old female status post internal fixation of right ankle fractures. EXAM:  PORTABLE RIGHT ANKLE - 2 VIEW COMPARISON:  Right ankle radiograph dated 08/30/2019. FINDINGS: Interval placement of 2 fixation screws through the fracture of the medial malleolus and internal fixation of distal fibular fracture with side plate and screws. The ankle mortise is in near anatomic alignment. There is soft tissue swelling of the ankle. Cutaneous staples noted. There is a cast overlying the ankle. IMPRESSION: Status post internal fixation of the previously seen fractures. Electronically Signed   By: Anner Crete M.D.   On: 09/01/2019 18:20   ECHOCARDIOGRAM COMPLETE  Result Date: 09/01/2019   ECHOCARDIOGRAM REPORT   Patient Name:   Jamie Roman Date of Exam: 09/01/2019 Medical Rec #:  [id number]           Height:       62.2 in Accession #:    [id number]          Weight:  160.3 lb Date of Birth:  11-Jan-1961            BSA:          1.74 m Patient Age:    59 years            BP:           158/70 mmHg Patient Gender: F                   HR:           59 bpm. Exam Location:  [facility] Procedure: 2D Echo, Cardiac Doppler and Color Doppler Indications:     Murmur 785.2  History:         Patient has no prior history of Echocardiogram examinations.                  Stroke, Signs/Symptoms:Shortness of Breath; Risk Factors:Sleep                  Apnea.  Sonographer:     Cristela Blue RDCS (AE) Referring Phys:  [id number] CHRISTOPHER END Diagnosing Phys: Yvonne Kendall MD IMPRESSIONS  1. Left ventricular ejection fraction, by visual estimation, is 60 to 65%. The left ventricle has normal function. There is moderately increased left ventricular hypertrophy.  2. Elevated left atrial pressure.  3. Left ventricular diastolic parameters are consistent with Grade I diastolic dysfunction (impaired relaxation).  4. The left ventricle has no regional wall motion abnormalities.  5. Global right ventricle has normal systolic function.The right ventricular size is normal. Mildly increased right ventricular wall thickness.  6.  Left atrial size was severely dilated.  7. Right atrial size was mildly dilated.  8. The mitral valve is degenerative. Mild mitral valve regurgitation. No evidence of mitral stenosis.  9. The tricuspid valve is normal in structure. 10. The tricuspid valve is normal in structure. Tricuspid valve regurgitation is trivial. 11. Aortic valve regurgitation is moderate. 12. The aortic valve is tricuspid. Aortic valve regurgitation is moderate. Mild aortic valve stenosis. 13. Pulmonic regurgitation is mild. 14. The pulmonic valve was not well visualized. Pulmonic valve regurgitation is mild. 15. 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 16. TR signal is inadequate for assessing pulmonary artery systolic pressure. 17. The inferior vena cava is normal in size with greater than 50% respiratory variability, suggesting right atrial pressure of 3 mmHg. 18. Question small interatrial shunt by color Doppler. FINDINGS  Left Ventricle: Left ventricular ejection fraction, by visual estimation, is 60 to 65%. The left ventricle has normal function. The left ventricle has no regional wall motion abnormalities. The left ventricular internal cavity size was the left ventricle is normal in size. There is moderately increased left ventricular hypertrophy. Left ventricular diastolic parameters are consistent with Grade I diastolic dysfunction (impaired relaxation). Elevated left atrial pressure. Right Ventricle: The right ventricular size is normal. Mildly increased right ventricular wall thickness. Global RV systolic function is has normal systolic function. Left Atrium: Left atrial size was severely dilated. Right Atrium: Right atrial size was mildly dilated Pericardium: There is no evidence of pericardial effusion. Mitral Valve: The mitral valve is degenerative in appearance. There is mild thickening of the mitral valve leaflet(s). Mild mitral valve regurgitation. No  evidence of mitral valve stenosis by observation. Tricuspid Valve: The tricuspid valve is normal in structure. Tricuspid valve regurgitation is trivial. Aortic Valve: The aortic valve is tricuspid. . There is mild thickening and mild calcification of  the aortic valve. Aortic valve regurgitation is moderate. Aortic regurgitation PHT measures 471 msec. Mild aortic stenosis is present. There is mild thickening of the aortic valve. There is mild calcification of the aortic valve. Aortic valve mean gradient measures 12.3 mmHg. Aortic valve peak gradient measures 23.2 mmHg. Aortic valve area, by VTI measures 1.71 cm. Pulmonic Valve: The pulmonic valve was not well visualized. Pulmonic valve regurgitation is mild. Pulmonic regurgitation is mild. No evidence of pulmonic stenosis. Aorta: The aortic root is normal in size and structure. 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 Venous: The inferior vena cava is normal in size with greater than 50% respiratory variability, suggesting right atrial pressure of 3 mmHg. IAS/Shunts: Question small interatrial shunt by color Doppler. Agitated saline contrast was given intravenously to evaluate for intracardiac shunting. Saline contrast bubble study was negative, with no evidence of any interatrial shunt.  LEFT VENTRICLE PLAX 2D LVIDd:         4.90 cm  Diastology LVIDs:         2.73 cm  LV e' lateral:   6.09 cm/s LV PW:         1.44 cm  LV E/e' lateral: 12.4 LV IVS:        1.07 cm  LV e' medial:    4.90 cm/s LVOT diam:     2.00 cm  LV E/e' medial:  15.4 LV SV:         85 ml LV SV Index:   46.99 LVOT Area:     3.14 cm  RIGHT VENTRICLE RV Basal diam:  3.59 cm RV S prime:     13.20 cm/s TAPSE (M-mode): 3.2 cm LEFT ATRIUM              Index       RIGHT ATRIUM           Index LA diam:        5.10 cm  2.92 cm/m  RA Area:     21.60 cm LA Vol (A2C):   138.0 ml 79.13 ml/m RA Volume:   59.00 ml  33.83 ml/m LA Vol  (A4C):   90.8 ml  52.06 ml/m LA Biplane Vol: 112.0 ml 64.22 ml/m  AORTIC VALVE                    PULMONIC VALVE AV Area (Vmax):    1.81 cm     PV Vmax:        1.17 m/s AV Area (Vmean):   1.96 cm     PV Peak grad:   5.5 mmHg AV Area (VTI):     1.71 cm     RVOT Peak grad: 8 mmHg AV Vmax:           240.67 cm/s AV Vmean:          159.333 cm/s AV VTI:            0.545 m AV Peak Grad:      23.2 mmHg AV Mean Grad:      12.3 mmHg LVOT Vmax:         139.00 cm/s LVOT Vmean:        99.600 cm/s LVOT VTI:          0.297 m LVOT/AV VTI ratio: 0.54 AI PHT:            471 msec  AORTA Ao Root diam: 3.50 cm MITRAL VALVE MV Area (  PHT): 2.66 cm              SHUNTS MV PHT:        82.65 msec            Systemic VTI:  0.30 m MV Decel Time: 285 msec              Systemic Diam: 2.00 cm MV E velocity: 75.70 cm/s  103 cm/s MV A velocity: 118.00 cm/s 70.3 cm/s MV E/A ratio:  0.64        1.5  Cristal Deer End MD Electronically signed by Yvonne Kendall MD Signature Date/Time: 09/01/2019/2:18:13 PM    Final     Assessment/Plan: 1 Day Post-Op   Principal Problem:   Bimalleolar ankle fracture, right, closed, initial encounter Active Problems:   HTN (hypertension), benign   COPD with acute exacerbation (HCC)   Acute respiratory failure with hypoxia (HCC)   Aneurysm of thoracic aorta (HCC)  Continue elevation of right lower extremity while in bed.  Continue physical therapy.  Patient is nonweightbearing on the right lower extremity x6 weeks postop.  Recommend patient continue Lovenox 40 mg daily for DVT prophylaxis x2 weeks.  Patient will follow-up with me in 10 to 14 days in my clinic for reevaluation and x-ray.    Juanell Fairly , MD 09/02/2019, 1:40 PM

## 2019-09-02 NOTE — Progress Notes (Signed)
Patients o2 level drops with ambulation with PT to 84% with 6.5L. upon rest it came up to 91% , To get RT to assess patient per Dr. Marylu Lund

## 2019-09-02 NOTE — Progress Notes (Signed)
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 She presented to the ED on 1/31 after mechanical fall at home and acute onset of right ankle pain without ability to bear weight. In the ED, patient was found to be hypoxic with O2 sat of 82 %, improved on 6 L/min oxygen by nasal cannula. Patient's daughter reported that she is supposed be on home oxygen but does not have it. Apparently noncompliant with pulmonology follow-up. Other vitals were stable. Chest x-ray was negative, COVID-19 was negative. CTA chest ruled out PE. She was found to have tib-fib fracture of the right ankle on x-ray. Orthopedics was consulted and patient admitted to hospitalist service due to hypoxia. Patient was subsequently on BiPAP. Has since been weaned off and is down to 2 L/min oxygen by nasal cannula as of this morning. CTA chest did show right middle lobe collapse likely due to mucous plugging, as well as bronchial wall thickening at the bilateral bases. Pulmonary (Dr. Welton FlakesKhan) was consulted for possible bronchoscopy.      Consultants:   Pulmonology Dr. Lennette BihariKohn, cardiology, orthopedics  Procedures:  Antimicrobials:   Ceftriaxone and doxycycline   Subjective: Pt mildly tachypnic with ambulation. Per nsg, 02 drops to 84% with with ambulation and increased to 91% on 6.5.  PT at bedside.  Objective: Vitals:   09/02/19 1141 09/02/19 1426 09/02/19 1527 09/02/19 1533  BP: 125/69  (!) 124/55   Pulse: 71  63 70  Resp:   12 18  Temp: 97.9 F (36.6 C)  98 F (36.7 C)   TempSrc: Oral  Oral   SpO2: 94% 95% 96% 93%  Weight:      Height:        Intake/Output Summary (Last 24  hours) at 09/02/2019 1804 Last data filed at 09/02/2019 1400 Gross per 24 hour  Intake 443 ml  Output 300 ml  Net 143 ml   Filed Weights   08/30/19 1309 08/31/19 0000  Weight: 68 kg 72.7 kg    Examination:  General exam: Appears calm and comfortable, NAD Respiratory system: decrease air exchage with poor respiratory effort, no wheezing Cardiovascular system: S1 & S2 heard, RRR. No JVD, murmurs, rubs, gallops or clicks. No pedal edema. Gastrointestinal system: Abdomen is nondistended, soft and nontender. No organomegaly or masses felt. Normal bowel sounds heard. Central nervous system: Alert and oriented. Grossly intact Extremities: no edema, RLE dressing in place.  Skin: warm, dry Psychiatry: Judgement and insight appear normal. Mood & affect appropriate.     Data Reviewed: I have personally reviewed following labs and imaging studies  CBC: Recent Labs  Lab 08/30/19 1649 08/31/19 0419 09/01/19 0503 09/02/19 0605  WBC 9.3 5.0 7.1 7.5  NEUTROABS 5.7  --  5.1  --   HGB 12.3 12.8 11.7* 11.8*  HCT 39.0 41.2 37.3 38.8  MCV 82.8 83.4 82.9 85.7  PLT 215 209 224 228   Basic Metabolic Panel: Recent Labs  Lab 08/30/19 1649 08/31/19 0419 09/01/19 0503 09/02/19 0605  NA 135 133* 138 140  K 3.6 4.1 4.3 4.3  CL 98 98 103 103  CO2 26 29 26 29   GLUCOSE 76 142* 167* 91  BUN 33* 36* 33* 24*  CREATININE 1.42* 1.28* 1.02* 1.03*  CALCIUM 8.6* 8.5* 8.9 8.8*  MG  --   --  2.1 2.6*   GFR: Estimated Creatinine Clearance: 53.8 mL/min (A) (by C-G formula based on SCr of 1.03 mg/dL (H)). Liver Function Tests: Recent Labs  Lab 08/30/19 1649  AST 31  ALT 15  ALKPHOS 87  BILITOT 0.9  PROT 6.9  ALBUMIN 3.1*   No results for input(s): LIPASE, AMYLASE in the last 168 hours. No results for input(s): AMMONIA in the last 168 hours. Coagulation Profile: Recent Labs  Lab 08/30/19 1649 08/31/19 0419  INR 1.1 1.0   Cardiac Enzymes: No results for input(s): CKTOTAL, CKMB, CKMBINDEX,  TROPONINI in the last 168 hours. BNP (last 3 results) No results for input(s): PROBNP in the last 8760 hours. HbA1C: No results for input(s): HGBA1C in the last 72 hours. CBG: Recent Labs  Lab 08/31/19 0012  GLUCAP 146*   Lipid Profile: No results for input(s): CHOL, HDL, LDLCALC, TRIG, CHOLHDL, LDLDIRECT in the last 72 hours. Thyroid Function Tests: No results for input(s): TSH, T4TOTAL, FREET4, T3FREE, THYROIDAB in the last 72 hours. Anemia Panel: No results for input(s): VITAMINB12, FOLATE, FERRITIN, TIBC, IRON, RETICCTPCT in the last 72 hours. Sepsis Labs: No results for input(s): PROCALCITON, LATICACIDVEN in the last 168 hours.  Recent Results (from the past 240 hour(s))  SARS CORONAVIRUS 2 (TAT 6-24 HRS)     Status: None   Collection Time: 08/30/19  3:47 PM  Result Value Ref Range Status   SARS Coronavirus 2 NEGATIVE NEGATIVE Final    Comment: (NOTE) SARS-CoV-2 target nucleic acids are NOT DETECTED. The SARS-CoV-2 RNA is generally detectable in upper and lower respiratory specimens during the acute phase of infection. Negative results do not preclude SARS-CoV-2 infection, do not rule out co-infections with other pathogens, and should not be used as the sole basis for treatment or other patient management decisions. Negative results must be combined with clinical observations, patient history, and epidemiological information. The expected result is Negative. Fact Sheet for Patients: SugarRoll.be Fact Sheet for Healthcare Providers: https://www.woods-mathews.com/ This test is not yet approved or cleared by the [place] FDA and  has been authorized for detection and/or diagnosis of SARS-CoV-2 by FDA under an Emergency Use Authorization (EUA). This EUA will remain  in effect (meaning this test can be used) for the duration of the COVID-19 declaration under Section 56 4(b)(1) of the Act, 21 U.S.C. section 360bbb-3(b)(1), unless  the authorization is terminated or revoked sooner. Performed at [facility], [place] [address] [place], [place] [address]   MRSA PCR Screening     Status: None   Collection Time: 08/31/19 12:16 AM   Specimen: Nasal Mucosa; Nasopharyngeal  Result Value Ref Range Status   MRSA by PCR NEGATIVE NEGATIVE Final    Comment:        The GeneXpert MRSA Assay (FDA approved for NASAL specimens only), is one component of a comprehensive MRSA colonization surveillance program. It is not intended to diagnose MRSA infection nor to guide or monitor treatment for MRSA infections. Performed at [facility], [address], [place], [place] [address]   Urine culture     Status: None   Collection Time: 08/31/19  5:38 AM   Specimen: Urine, Random  Result Value Ref Range Status   Specimen Description   Final    URINE, RANDOM Performed at [facility], [address], [place], [place] [address]    Special Requests   Final  NONE Performed at [facility], [address], [place], [place] [address]    Culture   Final    NO GROWTH Performed at [facility], [address] [place], [place] [address]    Report Status 09/01/2019 FINAL  Final         Radiology Studies: DG Chest 2 View  Result Date: 09/02/2019 CLINICAL DATA:  Dyspnea. EXAM: CHEST - 2 VIEW COMPARISON:  August 30, 2019. FINDINGS: Stable cardiomegaly. Stable position of thoracic stent graft. No pneumothorax is noted. Left lung is clear. Mild right pleural effusion is noted with associated atelectasis or infiltrate. Bony thorax is unremarkable. IMPRESSION: Mild right pleural effusion is noted with associated atelectasis or infiltrate. Electronically Signed   By: Lupita Raider M.D.   On: 09/02/2019 15:22   DG Ankle Right Port  Result Date: 09/01/2019 CLINICAL DATA:  59 year old female status post internal fixation of right ankle fractures. EXAM: PORTABLE RIGHT ANKLE - 2 VIEW COMPARISON:   Right ankle radiograph dated 08/30/2019. FINDINGS: Interval placement of 2 fixation screws through the fracture of the medial malleolus and internal fixation of distal fibular fracture with side plate and screws. The ankle mortise is in near anatomic alignment. There is soft tissue swelling of the ankle. Cutaneous staples noted. There is a cast overlying the ankle. IMPRESSION: Status post internal fixation of the previously seen fractures. Electronically Signed   By: Elgie Collard M.D.   On: 09/01/2019 18:20   ECHOCARDIOGRAM COMPLETE  Result Date: 09/01/2019   ECHOCARDIOGRAM REPORT   Patient Name:   Jamie Song Date of Exam: 09/01/2019 Medical Rec #:  [id number]           Height:       62.2 in Accession #:    [id number]          Weight:       160.3 lb Date of Birth:  [birth year]            BSA:          1.74 m Patient Age:    59 years            BP:           158/70 mmHg Patient Gender: F                   HR:           59 bpm. Exam Location:  [facility] Procedure: 2D Echo, Cardiac Doppler and Color Doppler Indications:     Murmur 785.2  History:         Patient has no prior history of Echocardiogram examinations.                  Stroke, Signs/Symptoms:Shortness of Breath; Risk Factors:Sleep                  Apnea.  Sonographer:     Cristela Blue RDCS (AE) Referring Phys:  [phone] CHRISTOPHER END Diagnosing Phys: Yvonne Kendall MD IMPRESSIONS  1. Left ventricular ejection fraction, by visual estimation, is 60 to 65%. The left ventricle has normal function. There is moderately increased left ventricular hypertrophy.  2. Elevated left atrial pressure.  3. Left ventricular diastolic parameters are consistent with Grade I diastolic dysfunction (impaired relaxation).  4. The left ventricle has no regional wall motion abnormalities.  5. Global right ventricle has normal systolic function.The right ventricular size is normal. Mildly increased right ventricular wall thickness.  6. Left atrial  size was severely dilated.  7.  Right atrial size was mildly dilated.  8. The mitral valve is degenerative. Mild mitral valve regurgitation. No evidence of mitral stenosis.  9. The tricuspid valve is normal in structure. 10. The tricuspid valve is normal in structure. Tricuspid valve regurgitation is trivial. 11. Aortic valve regurgitation is moderate. 12. The aortic valve is tricuspid. Aortic valve regurgitation is moderate. Mild aortic valve stenosis. 13. Pulmonic regurgitation is mild. 14. The pulmonic valve was not well visualized. Pulmonic valve regurgitation is mild. 15. 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 16. TR signal is inadequate for assessing pulmonary artery systolic pressure. 17. The inferior vena cava is normal in size with greater than 50% respiratory variability, suggesting right atrial pressure of 3 mmHg. 18. Question small interatrial shunt by color Doppler. FINDINGS  Left Ventricle: Left ventricular ejection fraction, by visual estimation, is 60 to 65%. The left ventricle has normal function. The left ventricle has no regional wall motion abnormalities. The left ventricular internal cavity size was the left ventricle is normal in size. There is moderately increased left ventricular hypertrophy. Left ventricular diastolic parameters are consistent with Grade I diastolic dysfunction (impaired relaxation). Elevated left atrial pressure. Right Ventricle: The right ventricular size is normal. Mildly increased right ventricular wall thickness. Global RV systolic function is has normal systolic function. Left Atrium: Left atrial size was severely dilated. Right Atrium: Right atrial size was mildly dilated Pericardium: There is no evidence of pericardial effusion. Mitral Valve: The mitral valve is degenerative in appearance. There is mild thickening of the mitral valve leaflet(s). Mild mitral valve regurgitation. No evidence of mitral valve stenosis by  observation. Tricuspid Valve: The tricuspid valve is normal in structure. Tricuspid valve regurgitation is trivial. Aortic Valve: The aortic valve is tricuspid. . There is mild thickening and mild calcification of the aortic valve. Aortic valve regurgitation is moderate. Aortic regurgitation PHT measures 471 msec. Mild aortic stenosis is present. There is mild thickening of the aortic valve. There is mild calcification of the aortic valve. Aortic valve mean gradient measures 12.3 mmHg. Aortic valve peak gradient measures 23.2 mmHg. Aortic valve area, by VTI measures 1.71 cm. Pulmonic Valve: The pulmonic valve was not well visualized. Pulmonic valve regurgitation is mild. Pulmonic regurgitation is mild. No evidence of pulmonic stenosis. Aorta: The aortic root is normal in size and structure. 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 Venous: The inferior vena cava is normal in size with greater than 50% respiratory variability, suggesting right atrial pressure of 3 mmHg. IAS/Shunts: Question small interatrial shunt by color Doppler. Agitated saline contrast was given intravenously to evaluate for intracardiac shunting. Saline contrast bubble study was negative, with no evidence of any interatrial shunt.  LEFT VENTRICLE PLAX 2D LVIDd:         4.90 cm  Diastology LVIDs:         2.73 cm  LV e' lateral:   6.09 cm/s LV PW:         1.44 cm  LV E/e' lateral: 12.4 LV IVS:        1.07 cm  LV e' medial:    4.90 cm/s LVOT diam:     2.00 cm  LV E/e' medial:  15.4 LV SV:         85 ml LV SV Index:   46.99 LVOT Area:     3.14 cm  RIGHT VENTRICLE RV  Basal diam:  3.59 cm RV S prime:     13.20 cm/s TAPSE (M-mode): 3.2 cm LEFT ATRIUM              Index       RIGHT ATRIUM           Index LA diam:        5.10 cm  2.92 cm/m  RA Area:     21.60 cm LA Vol (A2C):   138.0 ml 79.13 ml/m RA Volume:   59.00 ml  33.83 ml/m LA Vol (A4C):   90.8 ml  52.06 ml/m LA Biplane  Vol: 112.0 ml 64.22 ml/m  AORTIC VALVE                    PULMONIC VALVE AV Area (Vmax):    1.81 cm     PV Vmax:        1.17 m/s AV Area (Vmean):   1.96 cm     PV Peak grad:   5.5 mmHg AV Area (VTI):     1.71 cm     RVOT Peak grad: 8 mmHg AV Vmax:           240.67 cm/s AV Vmean:          159.333 cm/s AV VTI:            0.545 m AV Peak Grad:      23.2 mmHg AV Mean Grad:      12.3 mmHg LVOT Vmax:         139.00 cm/s LVOT Vmean:        99.600 cm/s LVOT VTI:          0.297 m LVOT/AV VTI ratio: 0.54 AI PHT:            471 msec  AORTA Ao Root diam: 3.50 cm MITRAL VALVE MV Area (PHT): 2.66 cm              SHUNTS MV PHT:        82.65 msec            Systemic VTI:  0.30 m MV Decel Time: 285 msec              Systemic Diam: 2.00 cm MV E velocity: 75.70 cm/s  103 cm/s MV A velocity: 118.00 cm/s 70.3 cm/s MV E/A ratio:  0.64        1.5  Cristal Deer End MD Electronically signed by Yvonne Kendall MD Signature Date/Time: 09/01/2019/2:18:13 PM    Final         Scheduled Meds: . acidophilus  2 capsule Oral BID  . amLODipine  10 mg Oral Daily  . atorvastatin  40 mg Oral QHS  . carvedilol  25 mg Oral BID  . Chlorhexidine Gluconate Cloth  6 each Topical Daily  . docusate sodium  100 mg Oral BID  . doxycycline  100 mg Oral Q12H  . enoxaparin (LOVENOX) injection  40 mg Subcutaneous Q24H  . guaiFENesin  600 mg Oral BID  . hydrochlorothiazide  12.5 mg Oral Daily  . ipratropium-albuterol  3 mL Nebulization TID  . loratadine  10 mg Oral Daily  . losartan  100 mg Oral Daily  . mometasone-formoterol  2 puff Inhalation BID  . predniSONE  40 mg Oral Q breakfast  . senna  1 tablet Oral BID  . sodium chloride flush  3 mL Intravenous Q12H  . traMADol  50 mg Oral Q6H   Continuous Infusions: .  sodium chloride Stopped (09/01/19 0012)  . cefTRIAXone (ROCEPHIN)  IV Stopped (09/01/19 2017)  . methocarbamol (ROBAXIN) IV      Assessment & Plan:   Principal Problem:   Bimalleolar ankle fracture, right, closed, initial  encounter Active Problems:   HTN (hypertension), benign   COPD with acute exacerbation (HCC)   Acute respiratory failure with hypoxia (HCC)   Aneurysm of thoracic aorta (HCC)   Bimalleolar ankle fracture, right, closed, initial encounter Secondaryto mechanical fall.  Cleared by cardiology for surgery this afternoon, 2/2 --Ortho following, Dr. Martha Clan --Nonweightbearing on right lower extremity x6 weeks postop -Recommend patient continue Lovenox 40 mg daily for DVT prophylaxis x2 weeks.  Patient will follow-up with Dr. Martha Clan in 10 to 14 days in my clinic for reevaluation and x-ray. --Pain control --Bowel regimen --PT eval postop-recommend SNF  Acute respiratory failure with hypoxiasecondary to Acute exacerbation ofCOPD Patient was initially requiring 6 L/min, then placed on BiPAP, has since been weaned to 2 L/min by nasal cannula. Had upper respiratory illness recently and increased COPD symptoms requiring increased use of rescue inhaler. --Supplemental oxygen, maintain O2 sat greater than 88% --DuoNebs scheduled --Albuterol every 2 hours as needed --Prednisone 40 daily --Incentive spirometry and flutter valve --Continue Rocephin and doxy --Continue prednisone --Antitussives --Strongly advised smoking cessation --Pulmonary consulted due to right middle lobe collapse seen on CTA chest    --added Mucomyst and chest PT    --Will need bronchoscopy at some point to rule out endobronchial malignancy, have asked Dr. Welton Flakes to reevaluate pt today  Descending thoracic aortic aneurysm-chronic,status post stent Measured 5.3 cm on CTA chest upon admission.Follow-up outpatient.  Essential hypertension-continue home amlodipine, Coreg, losartan, hydralazine Hyperlipidemia-continue Lipitor and hold aspirin for surgery   DVT prophylaxis: start lovenox Code Status: Full Code Family Communication:None at bedside Disposition Plan:needs pulmonary evaluation as still  requiring high oxygenation.  Needs SNF pending      LOS: 3 days   Time spent: 45 minutes with more than 50% COC    Lynn Ito, MD [facility] Pager [phone]  If 7PM-7AM, please contact night-coverage [email or web address] Password [facility] 09/02/2019, 6:04 PM

## 2019-09-02 NOTE — Anesthesia Postprocedure Evaluation (Signed)
Anesthesia Post Note  Patient: Jamie Roman  Procedure(s) Performed: OPEN REDUCTION INTERNAL FIXATION (ORIF) ANKLE FRACTURE (Right Ankle)  Patient location during evaluation: Nursing Unit Anesthesia Type: Spinal Level of consciousness: oriented and awake and alert Pain management: pain level controlled Vital Signs Assessment: post-procedure vital signs reviewed and stable Respiratory status: spontaneous breathing and respiratory function stable Cardiovascular status: blood pressure returned to baseline and stable Postop Assessment: no headache, no backache, no apparent nausea or vomiting and patient able to bend at knees Anesthetic complications: no     Last Vitals:  Vitals:   09/02/19 0847 09/02/19 1141  BP: 121/68 125/69  Pulse: 61 71  Resp:    Temp: (!) 36.3 C 36.6 C  SpO2: 94% 94%    Last Pain:  Vitals:   09/02/19 1141  TempSrc: Oral  PainSc:                  Jamie Roman

## 2019-09-02 NOTE — Evaluation (Signed)
Physical Therapy Evaluation Patient Details Name: Jamie Roman MRN: [id number] DOB: November 15, 1960 Today's Date: 09/02/2019   History of Present Illness  Per MD notes: Pt is a 59 y.o. female who presented to the ED with right ankle pain after fall at home. 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 Patient diagnosed with R closed bimalleolar ankle fracture and is s/p ORIF.    Clinical Impression  Pt pleasant and motivated to participate during the session.  Pt's SpO2 on 4.5LO2/min at rest was 91% and dropped to a low of 84% with amb with nursing and MD in room and aware.  Pt put forth good effort during the session and did not require significant physical assist with any functional tasks but was limited by NWB status and O2 desaturation with effort.  Pt has multiple steps to enter her home and would not be safe to return to her prior living situation at this time.  Pt will benefit from PT services in a SNF setting upon discharge to safely address deficits listed in patient problem list for decreased caregiver assistance and eventual return to PLOF.      Follow Up Recommendations SNF    Equipment Recommendations  Rolling walker with 5" wheels;3in1 (PT)    Recommendations for Other Services       Precautions / Restrictions Precautions Precautions: Fall Restrictions Weight Bearing Restrictions: Yes RLE Weight Bearing: Non weight bearing      Mobility  Bed Mobility Overal bed mobility: Modified Independent             General bed mobility comments: Extra time and effort required  Transfers Overall transfer level: Needs assistance Equipment used: Rolling walker (2 wheeled) Transfers: Sit to/from Stand Sit to Stand: Min guard         General transfer comment: Min verbal cues for sequencing to ensure RLE NWB  compliance  Ambulation/Gait Ambulation/Gait assistance: Min guard Gait Distance (Feet): 2 Feet Assistive device: Rolling walker (2 wheeled)   Gait velocity: decreased   General Gait Details: Pt able to perform 2-3 hop-to steps at the EOB with fair stability and with good compliance with WB status.  Pt's SpO2 dropped from 91% to 84% after amb on 4.5LO2/min, nsg aware  Stairs            Wheelchair Mobility    Modified Rankin (Stroke Patients Only)       Balance Overall balance assessment: Needs assistance   Sitting balance-Leahy Scale: Normal     Standing balance support: Bilateral upper extremity supported Standing balance-Leahy Scale: Fair                               Pertinent Vitals/Pain Pain Assessment: No/denies pain    Home Living Family/patient expects to be discharged to:: Private residence Living Arrangements: Children Available Help at Discharge: Family;Available 24 hours/day Type of Home: House Home Access: Stairs to enter Entrance Stairs-Rails: Right;Left;Can reach both Entrance Stairs-Number of Steps: 3 Home Layout: Two level;Able to live on main level with bedroom/bathroom Home Equipment: Shower seat;Grab bars - tub/shower      Prior Function Level of Independence: Independent         Comments: Ind amb without an AD community distances with 2 falls in the last 6 months secondary to "lose my  balance"; Ind with ADLs     Hand Dominance        Extremity/Trunk Assessment   Upper Extremity Assessment Upper Extremity Assessment: Generalized weakness    Lower Extremity Assessment Lower Extremity Assessment: Generalized weakness;RLE deficits/detail RLE: Unable to fully assess due to immobilization       Communication   Communication: No difficulties  Cognition Arousal/Alertness: Awake/alert Behavior During Therapy: WFL for tasks assessed/performed Overall Cognitive Status: Within Functional Limits for tasks assessed                                         General Comments      Exercises Total Joint Exercises Ankle Circles/Pumps: AROM;Strengthening;Left;10 reps;15 reps Quad Sets: Strengthening;Both;10 reps;15 reps Gluteal Sets: Strengthening;Both;10 reps;15 reps Heel Slides: AROM;Strengthening;Left;10 reps Hip ABduction/ADduction: AROM;Strengthening;Left;10 reps Straight Leg Raises: AROM;Strengthening;Left;10 reps Long Arc Quad: Strengthening;Both;10 reps Knee Flexion: Strengthening;Both;10 reps Other Exercises Other Exercises: HEP education for LLE APs and BLE GS, QS, and LAQs x 10 each 5-6x/day   Assessment/Plan    PT Assessment Patient needs continued PT services  PT Problem List Decreased strength;Decreased balance;Decreased mobility;Decreased activity tolerance;Decreased knowledge of use of DME;Decreased knowledge of precautions;Pain       PT Treatment Interventions DME instruction;Gait training;Stair training;Functional mobility training;Therapeutic activities;Therapeutic exercise;Balance training;Patient/family education    PT Goals (Current goals can be found in the Care Plan section)  Acute Rehab PT Goals Patient Stated Goal: To walk better and get home PT Goal Formulation: With patient Time For Goal Achievement: 09/15/19 Potential to Achieve Goals: Good    Frequency BID   Barriers to discharge Inaccessible home environment;Decreased caregiver support      Co-evaluation               AM-PAC PT "6 Clicks" Mobility  Outcome Measure Help needed turning from your back to your side while in a flat bed without using bedrails?: A Little Help needed moving from lying on your back to sitting on the side of a flat bed without using bedrails?: A Little Help needed moving to and from a bed to a chair (including a wheelchair)?: A Little Help needed standing up from a chair using your arms (e.g., wheelchair or bedside chair)?: A Little Help needed to walk in hospital  room?: Total Help needed climbing 3-5 steps with a railing? : Total 6 Click Score: 14    End of Session Equipment Utilized During Treatment: Gait belt;Oxygen Activity Tolerance: Treatment limited secondary to medical complications (Comment)(Pt's SpO2 dropped to 84% wtih amb) Patient left: in bed;with call bell/phone within reach;with bed alarm set;with nursing/sitter in room;with SCD's reapplied(Pt left with nursing and MD in room, declined up in chair) Nurse Communication: Mobility status;Other (comment)(Pt's SpO2 response to activity) PT Visit Diagnosis: Unsteadiness on feet (R26.81);History of falling (Z91.81);Other abnormalities of gait and mobility (R26.89);Muscle weakness (generalized) (M62.81);Pain Pain - Right/Left: Right Pain - part of body: Ankle and joints of foot    Time: 1308-6578 PT Time Calculation (min) (ACUTE ONLY): 38 min   Charges:   PT Evaluation $PT Eval Moderate Complexity: 1 Mod PT Treatments $Therapeutic Exercise: 8-22 mins        D. Scott Remmington Urieta PT, DPT 09/02/19, 1:16 PM

## 2019-09-02 NOTE — TOC Progression Note (Addendum)
Transition of Care [facility]) - Progression Note    Patient Details  Name: Jamie Roman MRN: [id number] Date of Birth: March 20, 1961  [facility]) CM/SW Contact  Barrie Dunker, RN Phone Number: 09/02/2019, 3:17 PM  Clinical Narrative:     According to Physical Therapy note the patient drops in O2 sats qualify her for Home o2, she will also need a RW and BSC I notified Brad with [employer] of the need, Pulmonology still to consult on the patient, will continue to monitor for needs, Patient is recommended to go to SNF but she is animate that she wants to go home with her daughter whom she lives with       Expected Discharge Plan and Services                                                 Social Determinants of Health (SDOH) Interventions    Readmission Risk Interventions No flowsheet data found.

## 2019-09-02 NOTE — Progress Notes (Signed)
Physical Therapy Treatment Patient Details Name: Jamie Roman MRN: [id number] DOB: 02-10-1961 Today's Date: 09/02/2019    History of Present Illness Per MD notes: Pt is a 59 y.o. female who presented to the ED with right ankle pain after fall at home. 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 Patient diagnosed with R closed bimalleolar ankle fracture and is s/p ORIF.    PT Comments    Patient was 100% compliant with exercises this afternoon and remains motivated. Patient was found with oxygen sat of 89% on 6L O2/min and desat to 82% with ambulation and transfers from a variety of surfaces.  Patient remained limited in her ability to ambulate and complete exercises because of her drops in O2 sat. Patient required increased frequency of therapeutic rest breaks due to O2 saturation and with PLB, patient on average took 1 minute to return to her baseline level of O2 sat, Nursing/MD aware. Pt will benefit from PT services in a SNF setting upon discharge to safely address deficits listed in patient problem list for decreased caregiver assistance and eventual return to PLOF.   Follow Up Recommendations  SNF     Equipment Recommendations  Rolling walker with 5" wheels;3in1 (PT)    Recommendations for Other Services       Precautions / Restrictions Precautions Precautions: Fall Required Braces or Orthoses: Splint/Cast Restrictions Weight Bearing Restrictions: Yes RLE Weight Bearing: Non weight bearing    Mobility  Bed Mobility Overal bed mobility: Modified Independent             General bed mobility comments: Extra time and effort required  Transfers Overall transfer level: Needs assistance Equipment used: Rolling walker (2 wheeled) Transfers: Sit to/from Stand Sit to Stand: Min guard         General transfer  comment: Min verbal cues for sequencing to ensure RLE NWB compliance and for hand placement when standing up from a surface  Ambulation/Gait Ambulation/Gait assistance: Min guard Gait Distance (Feet): 4 Feet (2x2 feet) Assistive device: Rolling walker (2 wheeled)   Gait velocity: decreased   General Gait Details: Pt able to perform 4-5 hop-to steps at the EOB with fair stability and with good compliance with WB status.  Pt's SpO2 dropped from 89% to 82% after amb on 6LO2/min, nsg aware   Stairs             Wheelchair Mobility    Modified Rankin (Stroke Patients Only)       Balance Overall balance assessment: Needs assistance   Sitting balance-Leahy Scale: Normal     Standing balance support: Bilateral upper extremity supported Standing balance-Leahy Scale: Fair                              Cognition Arousal/Alertness: Awake/alert Behavior During Therapy: WFL for tasks assessed/performed Overall Cognitive Status: Within Functional Limits for tasks assessed                                        Exercises Total Joint Exercises Ankle Circles/Pumps: AROM;Strengthening;Left;10 reps;15 reps Quad Sets: Strengthening;Both;10 reps Gluteal Sets: Strengthening;Both;10 reps Heel Slides: AROM;Strengthening;Left;10 reps Hip ABduction/ADduction: AROM;Strengthening;Left;10 reps Straight Leg Raises: AROM;Strengthening;Left;10 reps Long Arc Quad: Strengthening;Both;20 reps  Knee Flexion: Strengthening;Both;10 reps Other Exercises Other Exercises: HEP education for LLE APs and BLE GS, QS, and LAQs x 10 each 5-6x/day Other Exercises: sit to/from stand training from multiple surfaces Other Exercises: PLB training    General Comments        Pertinent Vitals/Pain Pain Assessment: 0-10 Pain Score: 9  Pain Location: R ankle Pain Descriptors / Indicators: Aching;Sore Pain Intervention(s): Monitored during session;RN gave pain meds during session     Arthur expects to be discharged to:: Private residence Living Arrangements: Children Available Help at Discharge: Family;Available 24 hours/day Type of Home: House Home Access: Stairs to enter Entrance Stairs-Rails: Right;Left;Can reach both Home Layout: Two level;Able to live on main level with bedroom/bathroom Home Equipment: Shower seat;Grab bars - tub/shower      Prior Function Level of Independence: Independent      Comments: Ind amb without an AD community distances with 2 falls in the last 6 months secondary to "lose my balance"; Ind with ADLs   PT Goals (current goals can now be found in the care plan section) Acute Rehab PT Goals Patient Stated Goal: To walk better and get home PT Goal Formulation: With patient Time For Goal Achievement: 09/15/19 Potential to Achieve Goals: Good Progress towards PT goals: Progressing toward goals    Frequency    BID      PT Plan Current plan remains appropriate    Co-evaluation              AM-PAC PT "6 Clicks" Mobility   Outcome Measure  Help needed turning from your back to your side while in a flat bed without using bedrails?: A Little Help needed moving from lying on your back to sitting on the side of a flat bed without using bedrails?: A Little Help needed moving to and from a bed to a chair (including a wheelchair)?: A Little Help needed standing up from a chair using your arms (e.g., wheelchair or bedside chair)?: A Little Help needed to walk in hospital room?: Total Help needed climbing 3-5 steps with a railing? : Total 6 Click Score: 14    End of Session Equipment Utilized During Treatment: Gait belt;Oxygen Activity Tolerance: Treatment limited secondary to medical complications (Comment)(pt desaturated to 82% with amb) Patient left: with call bell/phone within reach;with nursing/sitter in room;in chair;with chair alarm set Nurse Communication: Mobility status;Other (comment)(Pt's O2  saturation with rest and activity) PT Visit Diagnosis: Unsteadiness on feet (R26.81);History of falling (Z91.81);Other abnormalities of gait and mobility (R26.89);Muscle weakness (generalized) (M62.81);Pain Pain - Right/Left: Right Pain - part of body: Ankle and joints of foot     Time: 3419-6222 PT Time Calculation (min) (ACUTE ONLY): 26 min  Charges:  $Therapeutic Exercise: 8-22 mins $Therapeutic Activity: 8-22 mins                     D. Scott Shermika Balthaser PT, DPT 09/02/19, 2:26 PM

## 2019-09-02 NOTE — Progress Notes (Addendum)
Pulmonary Critical Care  Initial Consult Note  Jamie Roman HLK:[id number] DOB: 1961-07-03 DOA: 08/30/2019   HPI: Jamie Roman is a 59 y.o. female asked to come back and see the patient because of episode of desaturation.  Apparently the patient was being seen by physical therapy and while the patient was ambulating she had a desaturation down to 82%.  She was on 6 L at the time.  I did order chest x-ray and the chest x-ray reveals a pleural effusion with still some atelectasis but overall the appearance of the film would also be suggestive of some fluid overload.  She on prior admissions to [facility] had also needed oxygen as noted on my initial consult note the patient was still smoking unfortunately.  CT scan shows that she likely had some pulmonary arterial dilatation indicative of pulmonary hypertension.  In addition to this she had no pulmonary nodule seen at the CT scan screening which was done at [facility] in January.  I think her primary problem is for COPD she needs to be on oxygen and will need oxygen on discharge.    Past Medical History:  Diagnosis Date  . AAA (abdominal aortic aneurysm) (HCC)   . Bowel obstruction (HCC)   . COPD (chronic obstructive pulmonary disease) (HCC)   . Headache(784.0)   . Hypertension    Past Surgical History:  Procedure Laterality Date  . ABDOMINAL AORTIC ANEURYSM REPAIR    . COLOSTOMY    . COLOSTOMY REVERSAL    . ORIF ANKLE FRACTURE Right 09/01/2019   Procedure: OPEN REDUCTION INTERNAL FIXATION (ORIF) ANKLE FRACTURE;  Surgeon: Juanell Fairly, MD;  Location: [facility] ORS;  Service: Orthopedics;  Laterality: Right;   Social History:  reports that she has been smoking cigarettes. She has a 7.50 pack-year smoking history. She has never used smokeless tobacco. She reports current alcohol use. No history on file for drug.  No Known Allergies  History reviewed. No pertinent family history.  Prior to Admission medications   Medication Sig Start Date End  Date Taking? Authorizing Provider  amLODipine (NORVASC) 10 MG tablet Take 1 tablet (10 mg total) by mouth daily. 01/05/12 08/30/19 Yes Viyuoh, Adeline C, MD  ASPIRIN LOW DOSE 81 MG EC tablet Take 81 mg by mouth daily. 08/10/19  Yes [provider]  atorvastatin (LIPITOR) 40 MG tablet Take 40 mg by mouth at bedtime. 08/10/19  Yes [provider]  carvedilol (COREG) 25 MG tablet Take 25 mg by mouth 2 (two) times daily. 07/29/19  Yes [provider]  hydrochlorothiazide (MICROZIDE) 12.5 MG capsule Take 12.5 mg by mouth daily. 08/06/19  Yes [provider]  loratadine (CLARITIN) 10 MG tablet Take 10 mg by mouth daily. 06/13/19  Yes [provider]  losartan (COZAAR) 100 MG tablet Take 100 mg by mouth daily. 06/13/19  Yes [provider]  SYMBICORT 80-4.5 MCG/ACT inhaler Inhale 2 puffs into the lungs 2 (two) times daily. 08/21/19  Yes [provider]   Physical Exam: Vitals:   09/02/19 1426 09/02/19 1527 09/02/19 1533 09/02/19 1920  BP:  (!) 124/55  (!) 133/59  Pulse:  63 70 61  Resp:  12 18 16   Temp:  98 F (36.7 C)  98.3 F (36.8 C)  TempSrc:  Oral  Oral  SpO2: 95% 96% 93% 96%  Weight:      Height:        Wt Readings from Last 3 Encounters:  08/31/19 72.7 kg  01/04/12 71.1 kg  General:  Appears calm and comfortable Eyes: PERRL, normal lids, irises & conjunctiva ENT: grossly normal hearing, lips & tongue Neck: no LAD, masses or thyromegaly Cardiovascular: RRR, no m/r/g. No LE edema. Respiratory: CTA bilaterally, no w/r/r.       Normal respiratory effort. Abdomen: soft, nontender Skin: no rash or induration seen on limited exam Musculoskeletal: grossly normal tone BUE/BLE Psychiatric: grossly normal mood and affect Neurologic: grossly non-focal.          Labs on Admission:  Basic Metabolic Panel: Recent Labs  Lab 08/30/19 1649 08/31/19 0419 09/01/19 0503 09/02/19 0605  NA 135 133* 138 140  K 3.6 4.1 4.3 4.3  CL  98 98 103 103  CO2 26 29 26 29   GLUCOSE 76 142* 167* 91  BUN 33* 36* 33* 24*  CREATININE 1.42* 1.28* 1.02* 1.03*  CALCIUM 8.6* 8.5* 8.9 8.8*  MG  --   --  2.1 2.6*   Liver Function Tests: Recent Labs  Lab 08/30/19 1649  AST 31  ALT 15  ALKPHOS 87  BILITOT 0.9  PROT 6.9  ALBUMIN 3.1*   No results for input(s): LIPASE, AMYLASE in the last 168 hours. No results for input(s): AMMONIA in the last 168 hours. CBC: Recent Labs  Lab 08/30/19 1649 08/31/19 0419 09/01/19 0503 09/02/19 0605  WBC 9.3 5.0 7.1 7.5  NEUTROABS 5.7  --  5.1  --   HGB 12.3 12.8 11.7* 11.8*  HCT 39.0 41.2 37.3 38.8  MCV 82.8 83.4 82.9 85.7  PLT 215 209 224 228   Cardiac Enzymes: No results for input(s): CKTOTAL, CKMB, CKMBINDEX, TROPONINI in the last 168 hours.  BNP (last 3 results) Recent Labs    08/30/19 1649  BNP 75.0    ProBNP (last 3 results) No results for input(s): PROBNP in the last 8760 hours.  CBG: Recent Labs  Lab 08/31/19 0012  GLUCAP 146*    Radiological Exams on Admission: DG Chest 2 View  Result Date: 09/02/2019 CLINICAL DATA:  Dyspnea. EXAM: CHEST - 2 VIEW COMPARISON:  August 30, 2019. FINDINGS: Stable cardiomegaly. Stable position of thoracic stent graft. No pneumothorax is noted. Left lung is clear. Mild right pleural effusion is noted with associated atelectasis or infiltrate. Bony thorax is unremarkable. IMPRESSION: Mild right pleural effusion is noted with associated atelectasis or infiltrate. Electronically Signed   By: September 01, 2019 M.D.   On: 09/02/2019 15:22   DG Ankle Right Port  Result Date: 09/01/2019 CLINICAL DATA:  59 year old female status post internal fixation of right ankle fractures. EXAM: PORTABLE RIGHT ANKLE - 2 VIEW COMPARISON:  Right ankle radiograph dated 08/30/2019. FINDINGS: Interval placement of 2 fixation screws through the fracture of the medial malleolus and internal fixation of distal fibular fracture with side plate and screws. The ankle  mortise is in near anatomic alignment. There is soft tissue swelling of the ankle. Cutaneous staples noted. There is a cast overlying the ankle. IMPRESSION: Status post internal fixation of the previously seen fractures. Electronically Signed   By: 09/01/2019 M.D.   On: 09/01/2019 18:20   ECHOCARDIOGRAM COMPLETE  Result Date: 09/01/2019   ECHOCARDIOGRAM REPORT   Patient Name:   10/30/2019 Date of Exam: 09/01/2019 Medical Rec #:  10/30/2019           Height:       62.2 in Accession #:    [id number]          Weight:       160.3 lb  Date of Birth:  29-Jul-1961            BSA:          1.74 m Patient Age:    14 years            BP:           158/70 mmHg Patient Gender: F                   HR:           59 bpm. Exam Location:  [facility] Procedure: 2D Echo, Cardiac Doppler and Color Doppler Indications:     Murmur 785.2  History:         Patient has no prior history of Echocardiogram examinations.                  Stroke, Signs/Symptoms:Shortness of Breath; Risk Factors:Sleep                  Apnea.  Sonographer:     Sherrie Sport RDCS (AE) Referring Phys:  [phone] CHRISTOPHER END Diagnosing Phys: Nelva Bush MD IMPRESSIONS  1. Left ventricular ejection fraction, by visual estimation, is 60 to 65%. The left ventricle has normal function. There is moderately increased left ventricular hypertrophy.  2. Elevated left atrial pressure.  3. Left ventricular diastolic parameters are consistent with Grade I diastolic dysfunction (impaired relaxation).  4. The left ventricle has no regional wall motion abnormalities.  5. Global right ventricle has normal systolic function.The right ventricular size is normal. Mildly increased right ventricular wall thickness.  6. Left atrial size was severely dilated.  7. Right atrial size was mildly dilated.  8. The mitral valve is degenerative. Mild mitral valve regurgitation. No evidence of mitral stenosis.  9. The tricuspid valve is normal in structure. 10. The tricuspid valve is normal  in structure. Tricuspid valve regurgitation is trivial. 11. Aortic valve regurgitation is moderate. 12. The aortic valve is tricuspid. Aortic valve regurgitation is moderate. Mild aortic valve stenosis. 13. Pulmonic regurgitation is mild. 14. The pulmonic valve was not well visualized. Pulmonic valve regurgitation is mild. 54. 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 16. TR signal is inadequate for assessing pulmonary artery systolic pressure. 17. The inferior vena cava is normal in size with greater than 50% respiratory variability, suggesting right atrial pressure of 3 mmHg. 18. Question small interatrial shunt by color Doppler. FINDINGS  Left Ventricle: Left ventricular ejection fraction, by visual estimation, is 60 to 65%. The left ventricle has normal function. The left ventricle has no regional wall motion abnormalities. The left ventricular internal cavity size was the left ventricle is normal in size. There is moderately increased left ventricular hypertrophy. Left ventricular diastolic parameters are consistent with Grade I diastolic dysfunction (impaired relaxation). Elevated left atrial pressure. Right Ventricle: The right ventricular size is normal. Mildly increased right ventricular wall thickness. Global RV systolic function is has normal systolic function. Left Atrium: Left atrial size was severely dilated. Right Atrium: Right atrial size was mildly dilated Pericardium: There is no evidence of pericardial effusion. Mitral Valve: The mitral valve is degenerative in appearance. There is mild thickening of the mitral valve leaflet(s). Mild mitral valve regurgitation. No evidence of mitral valve stenosis by observation. Tricuspid Valve: The tricuspid valve is normal in structure. Tricuspid valve regurgitation is trivial. Aortic Valve: The aortic valve is tricuspid. . There is mild thickening and mild calcification of the aortic  valve. Aortic valve regurgitation is moderate. Aortic regurgitation PHT measures 471 msec. Mild aortic stenosis is present. There is mild thickening of the aortic valve. There is mild calcification of the aortic valve. Aortic valve mean gradient measures 12.3 mmHg. Aortic valve peak gradient measures 23.2 mmHg. Aortic valve area, by VTI measures 1.71 cm. Pulmonic Valve: The pulmonic valve was not well visualized. Pulmonic valve regurgitation is mild. Pulmonic regurgitation is mild. No evidence of pulmonic stenosis. Aorta: The aortic root is normal in size and structure. 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 Venous: The inferior vena cava is normal in size with greater than 50% respiratory variability, suggesting right atrial pressure of 3 mmHg. IAS/Shunts: Question small interatrial shunt by color Doppler. Agitated saline contrast was given intravenously to evaluate for intracardiac shunting. Saline contrast bubble study was negative, with no evidence of any interatrial shunt.  LEFT VENTRICLE PLAX 2D LVIDd:         4.90 cm  Diastology LVIDs:         2.73 cm  LV e' lateral:   6.09 cm/s LV PW:         1.44 cm  LV E/e' lateral: 12.4 LV IVS:        1.07 cm  LV e' medial:    4.90 cm/s LVOT diam:     2.00 cm  LV E/e' medial:  15.4 LV SV:         85 ml LV SV Index:   46.99 LVOT Area:     3.14 cm  RIGHT VENTRICLE RV Basal diam:  3.59 cm RV S prime:     13.20 cm/s TAPSE (M-mode): 3.2 cm LEFT ATRIUM              Index       RIGHT ATRIUM           Index LA diam:        5.10 cm  2.92 cm/m  RA Area:     21.60 cm LA Vol (A2C):   138.0 ml 79.13 ml/m RA Volume:   59.00 ml  33.83 ml/m LA Vol (A4C):   90.8 ml  52.06 ml/m LA Biplane Vol: 112.0 ml 64.22 ml/m  AORTIC VALVE                    PULMONIC VALVE AV Area (Vmax):    1.81 cm     PV Vmax:        1.17 m/s AV Area (Vmean):   1.96 cm     PV Peak grad:   5.5 mmHg AV Area (VTI):     1.71 cm     RVOT  Peak grad: 8 mmHg AV Vmax:           240.67 cm/s AV Vmean:          159.333 cm/s AV VTI:            0.545 m AV Peak Grad:      23.2 mmHg AV Mean Grad:      12.3 mmHg LVOT Vmax:         139.00 cm/s LVOT Vmean:        99.600 cm/s LVOT VTI:          0.297 m LVOT/AV VTI ratio: 0.54 AI PHT:            471 msec  AORTA Ao Root diam: 3.50 cm MITRAL VALVE MV Area (PHT): 2.66  cm              SHUNTS MV PHT:        82.65 msec            Systemic VTI:  0.30 m MV Decel Time: 285 msec              Systemic Diam: 2.00 cm MV E velocity: 75.70 cm/s  103 cm/s MV A velocity: 118.00 cm/s 70.3 cm/s MV E/A ratio:  0.64        1.5  Yvonne Kendallhristopher End MD Electronically signed by Yvonne Kendallhristopher End MD Signature Date/Time: 09/01/2019/2:18:13 PM    Final     EKG: Independently reviewed.  Assessment/Plan Principal Problem:   Bimalleolar ankle fracture, right, closed, initial encounter Active Problems:   HTN (hypertension), benign   COPD with acute exacerbation (HCC)   Acute respiratory failure with hypoxia (HCC)   Aneurysm of thoracic aorta (HCC)   1. COPD probable severe in nature do not have pulmonary functions readily available.  I would continue with nebulizers and inhalers as you are.  Also would continue with the prednisone.  She needs ongoing pulmonary toilet.  Once she is stabilized from her other issues we could do an airway examination however with her active desaturations it it would be of increased risk to perform an airway absent examination at this time.  As previously noted in my note I would recommend follow-up after discharge.  As far as the hypoxia is concerned currently she was saturating about 93 to 94% she does have desaturations with ambulation which I do not believe is necessarily a new finding based on her history however she definitely needs to have home oxygen. 2. Possible pulmonary atelectasis chest x-ray really does not look any different there is likely mucous plugging needs ongoing pulmonary toilet and an  airway examination once the patient is clinically stable this can be done as an outpatient 3. Acute respiratory failure with hypoxia doing better with the oxygen therapy which needs to be continued and again the importance of smoking cessation was stressed to the patient  Code Status: Full code  Family Communication: No family in the room today Disposition Plan: Home  Time spent: 6635  I have personally obtained a history, examined the patient, evaluated laboratory and imaging results, formulated the assessment and plan and placed orders.    Yevonne PaxSaadat A Khan, MD [facility] Pulmonary Critical Care Medicine Sleep Medicine

## 2019-09-03 DIAGNOSIS — J432 Centrilobular emphysema: Secondary | ICD-10-CM

## 2019-09-03 DIAGNOSIS — I712 Thoracic aortic aneurysm, without rupture: Secondary | ICD-10-CM

## 2019-09-03 DIAGNOSIS — I739 Peripheral vascular disease, unspecified: Secondary | ICD-10-CM

## 2019-09-03 DIAGNOSIS — I472 Ventricular tachycardia: Secondary | ICD-10-CM

## 2019-09-03 NOTE — Progress Notes (Signed)
  Subjective:  POD #2 s/p ORIF right bimalleolar ankle fracture.   Patient reports right ankle pain as mild.  Patient is lying bed with her right lower extremity elevated.  She denies any problems with her right ankle.  Review of the chart today states that the patient had a run of V. tach today.  Cardiology consulted.  Objective:   VITALS:   Vitals:   09/02/19 2027 09/02/19 2324 09/03/19 0736 09/03/19 1130  BP:  (!) 162/65 (!) 158/66 (!) 146/55  Pulse:  63 (!) 58 66  Resp:  16    Temp:  98.3 F (36.8 C) 98 F (36.7 C)   TempSrc:  Oral Oral   SpO2: 95% 98% 94% 96%  Weight:      Height:        PHYSICAL EXAM: Right lower extremity: Patient's right lower extremity splint and dressing are clean and dry.  She can flex and extend her toes.  Her toes are well-perfused.  She has intact sensation light touch.   LABS  No results found for this or any previous visit (from the past 24 hour(s)).  DG Chest 2 View  Result Date: 09/02/2019 CLINICAL DATA:  Dyspnea. EXAM: CHEST - 2 VIEW COMPARISON:  August 30, 2019. FINDINGS: Stable cardiomegaly. Stable position of thoracic stent graft. No pneumothorax is noted. Left lung is clear. Mild right pleural effusion is noted with associated atelectasis or infiltrate. Bony thorax is unremarkable. IMPRESSION: Mild right pleural effusion is noted with associated atelectasis or infiltrate. Electronically Signed   By: Lupita Raider M.D.   On: 09/02/2019 15:22   DG Ankle Right Port  Result Date: 09/01/2019 CLINICAL DATA:  59 year old female status post internal fixation of right ankle fractures. EXAM: PORTABLE RIGHT ANKLE - 2 VIEW COMPARISON:  Right ankle radiograph dated 08/30/2019. FINDINGS: Interval placement of 2 fixation screws through the fracture of the medial malleolus and internal fixation of distal fibular fracture with side plate and screws. The ankle mortise is in near anatomic alignment. There is soft tissue swelling of the ankle. Cutaneous  staples noted. There is a cast overlying the ankle. IMPRESSION: Status post internal fixation of the previously seen fractures. Electronically Signed   By: Elgie Collard M.D.   On: 09/01/2019 18:20    Assessment/Plan: 2 Days Post-Op   Principal Problem:   Bimalleolar ankle fracture, right, closed, initial encounter Active Problems:   HTN (hypertension), benign   COPD with acute exacerbation (HCC)   Acute respiratory failure with hypoxia (HCC)   Aneurysm of thoracic aorta (HCC)  Patient stable from an orthopedic standpoint.  Patient will keep her splint on until follow-up in our office in approximately 10 days.  Patient should continue strict elevation and nonweightbearing on the right lower extremity until follow-up.  Patient okay for discharge from an orthopedic standpoint once cleared medically.  Recommend Lovenox 40 mg daily for DVT prophylaxis x2 weeks postop.   Juanell Fairly , MD 09/03/2019, 3:48 PM

## 2019-09-03 NOTE — TOC Progression Note (Signed)
Transition of Care [facility]) - Progression Note    Patient Details  Name: Jamie Roman MRN: [id number] Date of Birth: October 08, 1960  Transition of Care [facility]) CM/SW [place], RN Phone Number: 09/03/2019, 10:01 AM  Clinical Narrative:    Met the patient in the room to discuss DC plan and needs She asked that I also call her daughter Caryl Pina at [phone], I attempted to call the daughter with no success, I left a VM requesting a call back The patient will need O2, RW, and BSC at home and I notified [place] with [employer].  The patient refuses SNF and wants to go home with [facility], I [facility] Kelp [facility] and they accepted, [facility] will call to set it up, Will continue to Monitor for needs until DC   Expected Discharge Plan: [place] Barriers to Discharge: Continued Medical Work up  Expected Discharge Plan and Services Expected Discharge Plan: [place]   Discharge Planning Services: CM Consult   Living arrangements for the past 2 months: Single Family Home                 DME Arranged: 3-N-1, Oxygen, [place] rolling DME Agency: [employer] Date DME Agency Contacted: 09/03/19 Time DME Agency Contacted: [phone] Representative spoke with at DME Agency: [place]: PT, OT, RN, Nurse's Aide [place] Agency: [facility] (formerly [employer]) Date [place]: 09/03/19 Time [place]: 1000 Representative spoke with at [place]: [place] ([place]) Interventions    Readmission Risk Interventions No flowsheet data found.

## 2019-09-03 NOTE — Progress Notes (Signed)
Physical Therapy Treatment Patient Details Name: Jamie Roman MRN: [id number] DOB: Apr 01, 1961 Today's Date: 09/03/2019    History of Present Illness Per MD notes: Pt is a 59 y.o. female who presented to the ED with right ankle pain after fall at home. 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 Patient diagnosed with R closed bimalleolar ankle fracture and is s/p ORIF.    PT Comments    Pt pleasant and motivated to participate during the session.  Pt put forth very good effort during the session but continued to be limited by fatigue and O2 desaturation with ambulation.  Pt's SpO2 at rest on 3.5LO2/min 93% with HR 65 bpm.  After amb 10' pt significantly fatigued with SpO2 down to 82% and HR 84 bpm.  Pt's SpO2 increased slowly back to >/= 88% with nursing present and aware.  During amb pt presented with effortful hops with minimal L foot clearance making pt unsafe to attempt stair training at this time.  Pt eager to return home but educated pt on continued recommendation for discharge to SNF to address pt's significant deficits in activity tolerance as well as concerns associated with pt getting in and out of the house with 3 stairs.  Pt will benefit from PT services in a SNF setting upon discharge to safely address deficits listed in patient problem list for decreased caregiver assistance and eventual return to PLOF.     Follow Up Recommendations  SNF     Equipment Recommendations  Rolling walker with 5" wheels;3in1 (PT)    Recommendations for Other Services       Precautions / Restrictions Precautions Precautions: Fall Required Braces or Orthoses: Splint/Cast Restrictions Weight Bearing Restrictions: Yes RLE Weight Bearing: Non weight bearing    Mobility  Bed Mobility Overal bed mobility: Modified Independent              General bed mobility comments: Extra time and effort required  Transfers Overall transfer level: Needs assistance Equipment used: Rolling walker (2 wheeled) Transfers: Sit to/from Stand Sit to Stand: Min guard         General transfer comment: Min verbal cues for sequencing to ensure RLE NWB compliance and for hand placement when standing up from a surface  Ambulation/Gait Ambulation/Gait assistance: Min guard Gait Distance (Feet): 10 Feet Assistive device: Rolling walker (2 wheeled)   Gait velocity: decreased   General Gait Details: Hop-to gait with fair stability but effortful with minimal foot clearance; pt fatigued significantly during amb with 10' her max distance, SpO2 down to 82% on 4LO2/min, nursing aware   Stairs             Wheelchair Mobility    Modified Rankin (Stroke Patients Only)       Balance Overall balance assessment: Needs assistance   Sitting balance-Leahy Scale: Normal     Standing balance support: Bilateral upper extremity supported Standing balance-Leahy Scale: Fair                              Cognition Arousal/Alertness: Awake/alert Behavior During Therapy: WFL for tasks assessed/performed Overall Cognitive Status: Within Functional Limits for tasks assessed  Exercises Total Joint Exercises Ankle Circles/Pumps: AROM;Strengthening;Left;10 reps;15 reps Quad Sets: Strengthening;Both;10 reps;15 reps Gluteal Sets: Strengthening;Both;10 reps;15 reps Long Arc Quad: Strengthening;Both;10 reps;15 reps Knee Flexion: Strengthening;Both;10 reps;15 reps Other Exercises Other Exercises: HEP review for LLE APs and BLE GS, QS, and LAQs x 10 each 5-6x/day Other Exercises: sit to/from stand training from multiple surfaces Other Exercises: Frequent SpO2/HR assessment and therapeutic rest breaks during the session    General Comments        Pertinent Vitals/Pain  Pain Assessment: 0-10 Pain Score: 4  Pain Location: R ankle Pain Descriptors / Indicators: Aching;Sore Pain Intervention(s): Premedicated before session;Monitored during session    Home Living                      Prior Function            PT Goals (current goals can now be found in the care plan section) Progress towards PT goals: Progressing toward goals    Frequency    BID      PT Plan Current plan remains appropriate    Co-evaluation              AM-PAC PT "6 Clicks" Mobility   Outcome Measure  Help needed turning from your back to your side while in a flat bed without using bedrails?: A Little Help needed moving from lying on your back to sitting on the side of a flat bed without using bedrails?: A Little Help needed moving to and from a bed to a chair (including a wheelchair)?: A Little Help needed standing up from a chair using your arms (e.g., wheelchair or bedside chair)?: A Little Help needed to walk in hospital room?: A Lot Help needed climbing 3-5 steps with a railing? : Total 6 Click Score: 15    End of Session Equipment Utilized During Treatment: Gait belt;Oxygen Activity Tolerance: Patient limited by fatigue;Treatment limited secondary to medical complications (Comment)(O2 desaturation) Patient left: with call bell/phone within reach;with nursing/sitter in room;in chair;with chair alarm set Nurse Communication: Mobility status;Other (comment)(SpO2 down to 82% with amb 10') PT Visit Diagnosis: Unsteadiness on feet (R26.81);History of falling (Z91.81);Other abnormalities of gait and mobility (R26.89);Muscle weakness (generalized) (M62.81);Pain Pain - Right/Left: Right Pain - part of body: Ankle and joints of foot     Time: 1101-1126 PT Time Calculation (min) (ACUTE ONLY): 25 min  Charges:  $Gait Training: 8-22 mins $Therapeutic Exercise: 8-22 mins                     D. Scott Hamp Moreland PT, DPT 09/03/19, 11:48 AM

## 2019-09-03 NOTE — TOC Progression Note (Signed)
Transition of Care [facility]) - Progression Note    Patient Details  Name: Jamie Roman MRN: [id number] Date of Birth: [birth year]  [facility]) CM/SW Contact  Barrie Dunker, RN Phone Number: 09/03/2019, 10:51 AM  Clinical Narrative:     [name] the patients daughter called and said that the patient will not go to SNF, She lives with the daughter and the daughter said that she needs a RW, BSC and Oxygen, She stated that the patient will not be able to use the oxygen in her house due to smoking and she can't keep her from smoking, I explained with COPD her Oxygen levels are dropping and she will NEED oxygen continuously going home, she stated that she can't keep the patient from smoking and it was not safe for her to be on Oxygen.  I explained that with COPD he oxygen need can continue to increase especially if she continues to smoke, the daughter stated understanding I let the daughter know that she is set up with [facility] PT and she said that she has a CNA that comes so she will not need an aide just the PT Brad with [employer] is aware of the 3 in 1 and RW need as well as the O2 need, Rosey Bath with [facility] has accepted the patient for PT, CM will continue to monitor to DC for additional needs  Expected Discharge Plan: Home w Home Health Services Barriers to Discharge: Continued Medical Work up  Expected Discharge Plan and Services Expected Discharge Plan: Home w Home Health Services   Discharge Planning Services: CM Consult   Living arrangements for the past 2 months: Single Family Home                 DME Arranged: 3-N-1, Oxygen, Walker rolling DME Agency: [employer] Date DME Agency Contacted: 09/03/19 Time DME Agency Contacted: [phone] Representative spoke with at DME Agency: Nida Boatman HH Arranged: PT, OT, RN, Nurse's Aide HH Agency: [facility] ([facility] Date HH Agency Contacted: 09/03/19 Time HH Agency Contacted: 1000 Representative spoke with  at [facility] Agency: Rosey Bath   Social Determinants of Health (SDOH) Interventions    Readmission Risk Interventions No flowsheet data found.

## 2019-09-03 NOTE — Progress Notes (Signed)
OT Cancellation Note  Patient Details Name: Jamie Roman MRN: [id number] DOB: 1961/05/17   Cancelled Treatment:    Reason Eval/Treat Not Completed: Other (comment). Thank you for the OT consult.Order received and chart reviewed. MD arrived to pt room immediately after OT this date. Pt left with MD for assessment. Will re-attempt at a later time/date as available and pt medically appropriate for OT evaluation.  Rockney Ghee, M.S., OTR/L Ascom: [phone] 09/03/19, 10:25 AM

## 2019-09-03 NOTE — Progress Notes (Signed)
Progress Note  Patient Name: Jamie Roman Date of Encounter: 09/03/2019  Primary Cardiologist: dr. Okey Dupre  Subjective   Recovering from ankle surgery, Cardiology reconsulted again for run of 15 beat nonsustained VT Patient was asymptomatic this morning 10 AM Notes reviewed from pulmonary, significant hypoxia noted, severe COPD, recommendation to stay on oxygen -Patient declining placement at rehab -Insisting on continued smoking in the house, does not want to be on oxygen in the house per family  Inpatient Medications    Scheduled Meds: . acidophilus  2 capsule Oral BID  . amLODipine  10 mg Oral Daily  . atorvastatin  40 mg Oral QHS  . carvedilol  25 mg Oral BID  . Chlorhexidine Gluconate Cloth  6 each Topical Daily  . docusate sodium  100 mg Oral BID  . doxycycline  100 mg Oral Q12H  . enoxaparin (LOVENOX) injection  40 mg Subcutaneous Q24H  . guaiFENesin  600 mg Oral BID  . hydrochlorothiazide  12.5 mg Oral Daily  . ipratropium-albuterol  3 mL Nebulization TID  . loratadine  10 mg Oral Daily  . losartan  100 mg Oral Daily  . mometasone-formoterol  2 puff Inhalation BID  . pantoprazole  20 mg Oral Daily  . predniSONE  40 mg Oral Q breakfast  . senna  1 tablet Oral BID  . sodium chloride flush  3 mL Intravenous Q12H  . traMADol  50 mg Oral Q6H   Continuous Infusions: . sodium chloride Stopped (09/02/19 2126)  . cefTRIAXone (ROCEPHIN)  IV Stopped (09/02/19 2106)  . methocarbamol (ROBAXIN) IV     PRN Meds: sodium chloride, acetaminophen **OR** acetaminophen, albuterol, alum & mag hydroxide-simeth, bisacodyl, HYDROmorphone (DILAUDID) injection, lip balm, magnesium hydroxide, methocarbamol **OR** methocarbamol (ROBAXIN) IV, ondansetron **OR** ondansetron (ZOFRAN) IV, oxyCODONE, oxyCODONE, polyethylene glycol, sodium phosphate   Vital Signs    Vitals:   09/02/19 2324 09/03/19 0736 09/03/19 1130 09/03/19 1700  BP: (!) 162/65 (!) 158/66 (!) 146/55 (!) 156/70  Pulse:  63 (!) 58 66 68  Resp: 16     Temp: 98.3 F (36.8 C) 98 F (36.7 C)  98.9 F (37.2 C)  TempSrc: Oral Oral  Oral  SpO2: 98% 94% 96% 98%  Weight:      Height:        Intake/Output Summary (Last 24 hours) at 09/03/2019 1816 Last data filed at 09/03/2019 1300 Gross per 24 hour  Intake 480 ml  Output 200 ml  Net 280 ml   Last 3 Weights 08/31/2019 08/30/2019 01/04/2012  Weight (lbs) 160 lb 4.4 oz 150 lb 156 lb 12 oz  Weight (kg) 72.7 kg 68.04 kg 71.1 kg      Telemetry    Normal sinus rhythm, run of 15 beat nonsustained VT this morning 10 AM- , very rare PVC personally Reviewed  ECG    Personally Reviewed  Physical Exam   GEN: No acute distress.   Neck: Unable to estimate JVD Cardiac: RRR, no murmurs, rubs, or gallops.  Respiratory: Severely decreased lung sounds bilaterally GI: Soft, nontender, non-distended  MS: No edema; No deformity. Right lower extremity bandaged to above the knee, brace in place Neuro:  Nonfocal  Psych: Tearful  Labs    High Sensitivity Troponin:   Recent Labs  Lab 08/30/19 1649  TROPONINIHS 17      Chemistry Recent Labs  Lab 08/30/19 1649 08/30/19 1649 08/31/19 0419 09/01/19 0503 09/02/19 0605  NA 135   < > 133* 138 140  K 3.6   < >  4.1 4.3 4.3  CL 98   < > 98 103 103  CO2 26   < > 29 26 29   GLUCOSE 76   < > 142* 167* 91  BUN 33*   < > 36* 33* 24*  CREATININE 1.42*   < > 1.28* 1.02* 1.03*  CALCIUM 8.6*   < > 8.5* 8.9 8.8*  PROT 6.9  --   --   --   --   ALBUMIN 3.1*  --   --   --   --   AST 31  --   --   --   --   ALT 15  --   --   --   --   ALKPHOS 87  --   --   --   --   BILITOT 0.9  --   --   --   --   GFRNONAA 40*   < > 45* 59* 59*  GFRAA 46*   < > 53* >60 >60  ANIONGAP 11   < > 6 9 8    < > = values in this interval not displayed.     Hematology Recent Labs  Lab 08/31/19 0419 09/01/19 0503 09/02/19 0605  WBC 5.0 7.1 7.5  RBC 4.94 4.50 4.53  HGB 12.8 11.7* 11.8*  HCT 41.2 37.3 38.8  MCV 83.4 82.9 85.7  MCH 25.9*  26.0 26.0  MCHC 31.1 31.4 30.4  RDW 16.3* 16.2* 16.8*  PLT 209 224 228    BNP Recent Labs  Lab 08/30/19 1649  BNP 75.0     DDimer No results for input(s): DDIMER in the last 168 hours.   Radiology    DG Chest 2 View  Result Date: 09/02/2019 CLINICAL DATA:  Dyspnea. EXAM: CHEST - 2 VIEW COMPARISON:  August 30, 2019. FINDINGS: Stable cardiomegaly. Stable position of thoracic stent graft. No pneumothorax is noted. Left lung is clear. Mild right pleural effusion is noted with associated atelectasis or infiltrate. Bony thorax is unremarkable. IMPRESSION: Mild right pleural effusion is noted with associated atelectasis or infiltrate. Electronically Signed   By: Marijo Conception M.D.   On: 09/02/2019 15:22    Cardiac Studies   Echocardiogram September 01, 2019 1. Left ventricular ejection fraction, by visual estimation, is 60 to  65%. The left ventricle has normal function. There is moderately increased  left ventricular hypertrophy.  2. Elevated left atrial pressure.  3. Left ventricular diastolic parameters are consistent with Grade I  diastolic dysfunction (impaired relaxation).  4. The left ventricle has no regional wall motion abnormalities.  5. Global right ventricle has normal systolic function Left atrial size was severely dilated.   Aortic valve regurgitation is moderate.  Mild aortic valve stenosis.  Known descending aortic dissection is partially visualized, better  characterized on CTA of the chest.    Patient Profile     Jamie Roman is a 59 y.o. 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   Stable per surgical notes Splint to  stay in place until office follow-up 10 days Strict elevation, nonweightbearing on the right and to follow-up Reports that she does not want to go to rehab  Nonsustained  VT 15 beat run this morning 10 AM, asymptomatic No further episodes noted throughout the course of the day Etiology unclear, possibly exacerbated by severe hypoxia, underlying COPD, though unable to exclude ischemia Normal ejection fraction She does have PAD, smoking history,  Coronary calcification noted on CT scan chest, particularly proximal LAD region --Unable to exclude ischemia === Discussed findings with her, we have recommended pharmacologic Myoview prior to discharge = On discussion of the test details, she started crying, feeling overwhelmed.  Indicated she would prefer to delay the test for now given everything that has been going on == Recommended that we hold her breakfast in the morning and discussed with her again early in the morning and if she again decides to delay the test, this could be done as an outpatient at her discretion  Pad/dissection of aorta Graft in place, noted on CT scan  COPD Continued smoking, severe symptoms, hypoxia Followed by pulmonary, smoking cessation recommended, continued oxygen  Long discussion with her concerning risk and benefit of a stress test, etiology of her ventricular tachycardia  Total encounter time more than 35 minutes  Greater than 50% was spent in counseling and coordination of care with the patient   For questions or updates, please contact [facility] Please consult [email or web address] for contact info under        Signed, Julien Nordmann, MD  09/03/2019, 6:16 PM

## 2019-09-03 NOTE — Progress Notes (Signed)
PT Cancellation Note  Patient Details Name: Jamie Roman MRN: [id number] DOB: 05-23-1961   Cancelled Treatment:    Reason Eval/Treat Not Completed: Medical issues which prohibited therapy; Per chart review pt with run of VT.  Spoke to MD with pt to be held this PM pending results of cardiology consult.  Will attempt to see pt at a future date/time as medically appropriate.     Denyce Robert Shivam Mestas PT, DPT 09/03/19, 1:50 PM

## 2019-09-03 NOTE — Plan of Care (Signed)
  Problem: Health Behavior/Discharge Planning: Goal: Ability to manage health-related needs will improve Outcome: Progressing   Problem: Clinical Measurements: Goal: Ability to maintain clinical measurements within normal limits will improve Outcome: Progressing Goal: Will remain free from infection Outcome: Progressing Goal: Diagnostic test results will improve Outcome: Progressing Goal: Respiratory complications will improve Outcome: Progressing Goal: Cardiovascular complication will be avoided Outcome: Progressing   Problem: Activity: Goal: Risk for activity intolerance will decrease Outcome: Progressing   Problem: Coping: Goal: Level of anxiety will decrease Outcome: Progressing   Problem: Elimination: Goal: Will not experience complications related to bowel motility Outcome: Progressing Goal: Will not experience complications related to urinary retention Outcome: Progressing   Problem: Pain Managment: Goal: General experience of comfort will improve Outcome: Progressing   Problem: Safety: Goal: Ability to remain free from injury will improve Outcome: Progressing   Problem: Education: Goal: Required Educational Video(s) Outcome: Progressing   Problem: Clinical Measurements: Goal: Postoperative complications will be avoided or minimized Outcome: Progressing   Problem: Skin Integrity: Goal: Demonstration of wound healing without infection will improve Outcome: Progressing

## 2019-09-03 NOTE — Progress Notes (Signed)
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 She presented to the ED on 1/31 after mechanical fall at home and acute onset of right ankle pain without ability to bear weight. In the ED, patient was found to be hypoxic with O2 sat of 82 %, improved on 6 L/min oxygen by nasal cannula. Patient's daughter reported that she is supposed be on home oxygen but does not have it. Apparently noncompliant with pulmonology follow-up. Other vitals were stable. Chest x-ray was negative, COVID-19 was negative. CTA chest ruled out PE. She was found to have tib-fib fracture of the right ankle on x-ray. Orthopedics was consulted and patient admitted to hospitalist service due to hypoxia. Patient was subsequently on BiPAP. Has since been weaned off and is down to 2 L/min oxygen by nasal cannula as of this morning. CTA chest did show right middle lobe collapse likely due to mucous plugging, as well as bronchial wall thickening at the bilateral bases. Pulmonary (Dr. Welton Flakes) was consulted for possible bronchoscopy.      Consultants:   Pulmonology Dr. Lennette Bihari, cardiology, orthopedics  Procedures:  Antimicrobials:   Ceftriaxone and doxycycline   Subjective: Feeling a little better today.  Had a long discussion with patient about needing oxygen at home and to stop smoking.  Also had a discussion about not being able to smoke while using oxygen and she verbalizes understanding due to the complications.  She is interested in quitting and she will try.  She denies any fever, chills, cough. Nursing called and said telemetry stated patient had 15 beats of VT Objective: Vitals:     09/02/19 2324 09/03/19 0736 09/03/19 1130 09/03/19 1700  BP: (!) 162/65 (!) 158/66 (!) 146/55 (!) 156/70  Pulse: 63 (!) 58 66 68  Resp: 16     Temp: 98.3 F (36.8 C) 98 F (36.7 C)  98.9 F (37.2 C)  TempSrc: Oral Oral  Oral  SpO2: 98% 94% 96% 98%  Weight:      Height:        Intake/Output Summary (Last 24 hours) at 09/03/2019 1924 Last data filed at 09/03/2019 1829 Gross per 24 hour  Intake 240 ml  Output 400 ml  Net -160 ml   Filed Weights   08/30/19 1309 08/31/19 0000  Weight: 68 kg 72.7 kg    Examination:  General exam: Appears calm and comfortable, NAD, sitting up in bed Respiratory system: decrease air exchage with poor respiratory effort, no wheezing Cardiovascular system: S1 & S2 heard, RRR. 2/6 hsm murmurs, rubs, gallops or clicks. No pedal edema. Gastrointestinal system: Abdomen is nondistended, soft and nontender. No organomegaly or masses felt. Normal bowel sounds heard. Central nervous system: Alert and oriented. Grossly intact Extremities: no edema, RLE dressing in place.  Skin: warm, dry Psychiatry: Judgement and insight appear normal. Mood & affect appropriate.     Data Reviewed: I have personally reviewed following labs and imaging studies  CBC: Recent Labs  Lab 08/30/19 1649 08/31/19 0419 09/01/19 0503 09/02/19 0605  WBC 9.3 5.0 7.1 7.5  NEUTROABS 5.7  --  5.1  --   HGB 12.3 12.8 11.7* 11.8*  HCT 39.0 41.2 37.3 38.8  MCV 82.8 83.4 82.9 85.7  PLT 215 209 224 527   Basic Metabolic Panel: Recent Labs  Lab 08/30/19 1649 08/31/19 0419 09/01/19 0503 09/02/19 0605  NA 135 133* 138 140  K 3.6 4.1 4.3 4.3  CL 98 98 103 103  CO2 26 29 26 29   GLUCOSE 76 142* 167* 91  BUN 33* 36* 33* 24*  CREATININE 1.42* 1.28* 1.02* 1.03*  CALCIUM 8.6* 8.5* 8.9 8.8*  MG  --   --  2.1 2.6*   GFR: Estimated Creatinine Clearance: 53.8 mL/min (A) (by C-G formula based on SCr of 1.03 mg/dL (H)). Liver Function Tests: Recent Labs  Lab 08/30/19 1649  AST 31   ALT 15  ALKPHOS 87  BILITOT 0.9  PROT 6.9  ALBUMIN 3.1*   No results for input(s): LIPASE, AMYLASE in the last 168 hours. No results for input(s): AMMONIA in the last 168 hours. Coagulation Profile: Recent Labs  Lab 08/30/19 1649 08/31/19 0419  INR 1.1 1.0   Cardiac Enzymes: No results for input(s): CKTOTAL, CKMB, CKMBINDEX, TROPONINI in the last 168 hours. BNP (last 3 results) No results for input(s): PROBNP in the last 8760 hours. HbA1C: No results for input(s): HGBA1C in the last 72 hours. CBG: Recent Labs  Lab 08/31/19 0012  GLUCAP 146*   Lipid Profile: No results for input(s): CHOL, HDL, LDLCALC, TRIG, CHOLHDL, LDLDIRECT in the last 72 hours. Thyroid Function Tests: No results for input(s): TSH, T4TOTAL, FREET4, T3FREE, THYROIDAB in the last 72 hours. Anemia Panel: No results for input(s): VITAMINB12, FOLATE, FERRITIN, TIBC, IRON, RETICCTPCT in the last 72 hours. Sepsis Labs: No results for input(s): PROCALCITON, LATICACIDVEN in the last 168 hours.  Recent Results (from the past 240 hour(s))  SARS CORONAVIRUS 2 (TAT 6-24 HRS)     Status: None   Collection Time: 08/30/19  3:47 PM  Result Value Ref Range Status   SARS Coronavirus 2 NEGATIVE NEGATIVE Final    Comment: (NOTE) SARS-CoV-2 target nucleic acids are NOT DETECTED. The SARS-CoV-2 RNA is generally detectable in upper and lower respiratory specimens during the acute phase of infection. Negative results do not preclude SARS-CoV-2 infection, do not rule out co-infections with other pathogens, and should not be used as the sole basis for treatment or other patient management decisions. Negative results must be combined with clinical observations, patient history, and epidemiological information. The expected result is Negative. Fact Sheet for Patients: SugarRoll.be Fact Sheet for Healthcare Providers: https://www.woods-mathews.com/ This test is not yet approved or  cleared by the [place] FDA and  has been authorized for detection and/or diagnosis of SARS-CoV-2 by FDA under an Emergency Use Authorization (EUA). This EUA will remain  in effect (meaning this test can be used) for the duration of the COVID-19 declaration under Section 56 4(b)(1) of the Act, 21 U.S.C. section 360bbb-3(b)(1), unless the authorization is terminated or revoked sooner. Performed at [facility], [place] [address], [place], [place] [address]   MRSA PCR Screening     Status: None   Collection Time: 08/31/19 12:16 AM   Specimen: Nasal Mucosa; Nasopharyngeal  Result Value Ref Range Status   MRSA by PCR NEGATIVE NEGATIVE Final    Comment:        The GeneXpert MRSA Assay (FDA approved for NASAL specimens only), is one component of a comprehensive MRSA colonization surveillance program. It is not intended to diagnose MRSA infection nor to guide or monitor treatment for MRSA infections. Performed at [facility], [address] [place], [place] [address]  Urine culture     Status: None   Collection Time: 08/31/19  5:38 AM   Specimen: Urine, Random  Result Value Ref Range Status   Specimen Description   Final    URINE, RANDOM Performed at [facility], [address], [place], [place] [address]    Special Requests   Final    NONE Performed at [facility], [address], [place], [place] [address]    Culture   Final    NO GROWTH Performed at [facility], [address], [place], [place] [address]    Report Status 09/01/2019 FINAL  Final         Radiology Studies: DG Chest 2 View  Result Date: 09/02/2019 CLINICAL DATA:  Dyspnea. EXAM: CHEST - 2 VIEW COMPARISON:  August 30, 2019. FINDINGS: Stable cardiomegaly. Stable position of thoracic stent graft. No pneumothorax is noted. Left lung is clear. Mild right pleural effusion is noted with associated atelectasis or infiltrate. Bony thorax is unremarkable.  IMPRESSION: Mild right pleural effusion is noted with associated atelectasis or infiltrate. Electronically Signed   By: Lupita Raider M.D.   On: 09/02/2019 15:22        Scheduled Meds: . acidophilus  2 capsule Oral BID  . amLODipine  10 mg Oral Daily  . atorvastatin  40 mg Oral QHS  . carvedilol  25 mg Oral BID  . Chlorhexidine Gluconate Cloth  6 each Topical Daily  . docusate sodium  100 mg Oral BID  . doxycycline  100 mg Oral Q12H  . enoxaparin (LOVENOX) injection  40 mg Subcutaneous Q24H  . guaiFENesin  600 mg Oral BID  . hydrochlorothiazide  12.5 mg Oral Daily  . ipratropium-albuterol  3 mL Nebulization TID  . loratadine  10 mg Oral Daily  . losartan  100 mg Oral Daily  . mometasone-formoterol  2 puff Inhalation BID  . pantoprazole  20 mg Oral Daily  . predniSONE  40 mg Oral Q breakfast  . senna  1 tablet Oral BID  . sodium chloride flush  3 mL Intravenous Q12H  . traMADol  50 mg Oral Q6H   Continuous Infusions: . sodium chloride Stopped (09/02/19 2126)  . cefTRIAXone (ROCEPHIN)  IV Stopped (09/02/19 2106)  . methocarbamol (ROBAXIN) IV      Assessment & Plan:   Principal Problem:   Bimalleolar ankle fracture, right, closed, initial encounter Active Problems:   HTN (hypertension), benign   COPD with acute exacerbation (HCC)   Acute respiratory failure with hypoxia (HCC)   Aneurysm of thoracic aorta (HCC)   Bimalleolar ankle fracture, right, closed, initial encounter Secondaryto mechanical fall.  Cleared by cardiology for surgery this afternoon, 2/2 --Ortho following, Dr. Martha Clan --Nonweightbearing on right lower extremity x6 weeks postop -Recommend patient continue Lovenox 40 mg daily for DVT prophylaxis x2 weeks.  Patient will follow-up with Dr. Martha Clan in 10 to 14 days in my clinic for reevaluation and x-ray. --Pain control --Bowel regimen --PT eval postop-recommend SNF  Acute respiratory failure with hypoxiasecondary to Acute exacerbation  ofCOPD Patient was initially requiring 6 L/min, then placed on BiPAP, has since been weaned to 2 L/min by nasal cannula. Had upper respiratory illness recently and increased COPD symptoms requiring increased use of rescue inhaler. --Supplemental oxygen, maintain O2 sat greater than 88% --DuoNebs scheduled --Albuterol every 2 hours as needed --Prednisone 40 daily --Incentive spirometry and flutter valve --Continue Rocephin and doxy --Continue prednisone --Antitussives --Strongly advised smoking cessation --Pulmonary consulted due to  right middle lobe collapse seen on CTA chest    --added Mucomyst and chest PT    --Will need bronchoscopy at some point to rule out endobronchial malignancy, however discussed by phone with Dr. Lennette Bihari and as he reviewed the CT chest closely is slightly mucous plugging.  She will need further evaluation as outpatient but at the current time with her oxygenation dropping he prefers to wait and have a follow-up as outpatient with her primary pulmonology. Per pulmonology recommend slow prednisone taper. Again I had a long discussion with patient about smoking cessation and she said she is interested in doing so.  Also discussed that she needs oxygen at home but she cannot smoke near her oxygen tank or even using oxygen or have anyone near it as it will cause combustion and can lead to burns and death even.  She verbalizes an understanding about this.  Descending thoracic aortic aneurysm-chronic,status post stent Measured 5.3 cm on CTA chest upon admission.Follow-up outpatient.  Essential hypertension-continue home amlodipine, Coreg, losartan, hydralazine Hyperlipidemia-continue Lipitor and hold aspirin for surgery  NSVT-15 beats of VT.  Patient was asymptomatic. Echo with normal EF. Cardiology was consulted appreciate input.  Possible etiology could be exacerbated by severe hypoxia underlying COPD but cannot exclude ischemia as patient does have multiple  cardiac risk factors. Plan for pharmacologic Myoview prior to discharge to rule out cardiac ischemia as cause of VT. We will keep her n.p.o. tonight as hopefully she will consider proceeding with this test in a.m.   DVT prophylaxis: start lovenox Code Status: Full Code Family Communication:None at bedside Disposition Plan:Declined SNF. Had NSVT today as mentioned above, will need ischemic w/u prior to discharge, npo tonight and if agreeable will proceed with stress nuclear in am. Cards following   LOS: 4 days   Time spent: 45 minutes with more than 50% COC    Lynn Ito, MD [facility] Pager [phone]  If 7PM-7AM, please contact night-coverage [email or web address] Password [facility] 09/03/2019, 7:24 PM Patient ID: Jamie Roman, female   DOB: 1960-12-17, 59 y.o.   MRN: [id number]

## 2019-09-03 NOTE — Progress Notes (Signed)
This RN notified pt had 15beat run of VT via tele, she returned to SB at 58. Asymptomatic. Notifed MD. Awaiting orders. Cont to monitor pt.

## 2019-09-03 NOTE — Progress Notes (Cosign Needed)
Patient suffers from weakness, COPD which impairs their ability to perform daily activities like ADLs in the home.  A walking aid will not resolve  issue with performing activities of daily living. A wheelchair will allow patient to safely perform daily activities. Patient is not able to propel themselves in the home using a standard weight wheelchair due to weakness. Patient can self propel in the lightweight wheelchair. Length of need 99 months. Accessories: elevating leg rests ELRs, wheel locks, extensions and anti-tippers.and Back Cushion

## 2019-09-04 DIAGNOSIS — I472 Ventricular tachycardia, unspecified: Secondary | ICD-10-CM

## 2019-09-04 LAB — CREATININE, SERUM
Creatinine, Ser: 0.76 mg/dL (ref 0.44–1.00)
GFR calc Af Amer: >60 mL/min (ref >60)
GFR calc non Af Amer: >60 mL/min (ref >60)

## 2019-09-04 MED ORDER — ACETAMINOPHEN 325 MG PO TABS: 650.0000 mg | ORAL_TABLET | Freq: Four times a day (QID) | ORAL | Status: AC | PRN

## 2019-09-04 MED ORDER — RISAQUAD PO CAPS: 2.0000 | ORAL_CAPSULE | Freq: Two times a day (BID) | ORAL | 0 refills | Status: AC

## 2019-09-04 MED ORDER — GUAIFENESIN ER 600 MG PO TB12: 600.0000 mg | ORAL_TABLET | Freq: Two times a day (BID) | ORAL | 0 refills | Status: AC

## 2019-09-04 MED ORDER — PREDNISONE 20 MG PO TABS: 40.0000 mg | ORAL_TABLET | Freq: Every day | ORAL | 0 refills | Status: AC

## 2019-09-04 MED ORDER — PANTOPRAZOLE SODIUM 20 MG PO TBEC: 20.0000 mg | DELAYED_RELEASE_TABLET | Freq: Every day | ORAL | 0 refills | Status: AC

## 2019-09-04 MED ORDER — ALBUTEROL SULFATE HFA 108 (90 BASE) MCG/ACT IN AERS: 2.0000 | INHALATION_SPRAY | Freq: Four times a day (QID) | RESPIRATORY_TRACT | 0 refills | Status: AC | PRN

## 2019-09-04 MED ORDER — PREDNISONE 10 MG PO TABS: 30.0000 mg | ORAL_TABLET | Freq: Every day | ORAL | 0 refills | Status: AC

## 2019-09-04 MED ORDER — ENOXAPARIN SODIUM 40 MG/0.4ML ~~LOC~~ SOLN: 40.0000 mg | SUBCUTANEOUS | 0 refills | Status: AC

## 2019-09-04 MED ORDER — PREDNISONE 20 MG PO TABS: 20.0000 mg | ORAL_TABLET | Freq: Every day | ORAL | 0 refills | Status: AC

## 2019-09-04 MED ORDER — DOXYCYCLINE HYCLATE 100 MG PO TABS: 100.0000 mg | ORAL_TABLET | Freq: Two times a day (BID) | ORAL | 0 refills | Status: AC

## 2019-09-04 NOTE — Progress Notes (Signed)
Physical Therapy Treatment Patient Details Name: Jamie Roman MRN: [id number] DOB: 01-30-1961 Today's Date: 09/04/2019    History of Present Illness Pt is a 59 y.o. female who presented to the ED with right ankle pain after fall at home. 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 Patient diagnosed with R closed bimalleolar ankle fracture and is s/p ORIF.    PT Comments    Pt pleasant and motivated to participate during the session.  Pt's SpO2 dropped to a low of 88% on 3LO2/min this session occurring during ambulation and quickly returned toward her resting baseline of 97%.  Pt continued to struggle with amb this session with a max distance of 12 feet and minimal L foot clearance.  Pt would not be safe attempting stair training at this time with SNF recommendation reinforced with pt.  Pt reported that has several "very strong" family members that can essentially carry her up her stairs.  Pt's RLE NWB status reinforced with patient.  Pt will benefit from PT services in a SNF setting upon discharge to safely address deficits listed in patient problem list for decreased caregiver assistance and eventual return to PLOF.     Follow Up Recommendations  SNF     Equipment Recommendations  Rolling walker with 5" wheels;3in1 (PT)    Recommendations for Other Services       Precautions / Restrictions Precautions Precautions: Fall Precaution Comments: watch O2 sats w/ exertion Required Braces or Orthoses: Splint/Cast Splint/Cast: RLE Restrictions Weight Bearing Restrictions: Yes RLE Weight Bearing: Non weight bearing    Mobility  Bed Mobility Overal bed mobility: Modified Independent             General bed mobility comments: Extra time and effort required  Transfers Overall transfer level: Needs assistance Equipment  used: Rolling walker (2 wheeled) Transfers: Sit to/from Stand Sit to Stand: Min guard;Supervision         General transfer comment: Min verbal cues for sequencing to ensure RLE NWB compliance and for hand placement when standing up from a surface  Ambulation/Gait Ambulation/Gait assistance: Min guard Gait Distance (Feet): 12 Feet Assistive device: Rolling walker (2 wheeled)   Gait velocity: decreased   General Gait Details: Hop-to gait with fair stability but effortful with minimal foot clearance; SpO2 down to a low of 88% on 3LO2/min   Stairs             Wheelchair Mobility    Modified Rankin (Stroke Patients Only)       Balance Overall balance assessment: Needs assistance   Sitting balance-Leahy Scale: Normal     Standing balance support: Bilateral upper extremity supported Standing balance-Leahy Scale: Fair                              Cognition Arousal/Alertness: Awake/alert Behavior During Therapy: WFL for tasks assessed/performed Overall Cognitive Status: Within Functional Limits for tasks assessed                                        Exercises Total Joint Exercises Ankle Circles/Pumps: AROM;Strengthening;Left;10 reps;15 reps Quad Sets: Strengthening;Both;10 reps;15 reps Gluteal Sets: Strengthening;Both;10 reps;15 reps Towel Squeeze: Strengthening;Both;10 reps Hip ABduction/ADduction: AROM;Strengthening;Left;10 reps Straight Leg  Raises: AROM;Strengthening;Left;10 reps Long Arc Quad: Strengthening;Both;10 reps;15 reps Knee Flexion: Strengthening;Both;10 reps;15 reps Other Exercises Other Exercises: HEP review for LLE APs and BLE GS, QS, and LAQs x 10 each 5-6x/day Other Exercises: Frequent SpO2/HR assessment and therapeutic rest breaks during the session Other Exercises: Pt instructed in energy conservation strategies, pursed lip breathing, AE/DME and adaptive strategies for ADL given WB'ing precautions, and home O2  mgt strategies    General Comments General comments (skin integrity, edema, etc.): EOB O2 93% and HR 68; after standing at sink for grooming tasks ~9min, 91% O2, HR 81; once in recliner with rest/PLB, O2 95% and HR 66; on 3L throughout      Pertinent Vitals/Pain Pain Assessment: No/denies pain    Home Living Family/patient expects to be discharged to:: Private residence Living Arrangements: Children Available Help at Discharge: Family;Available 24 hours/day Type of Home: House Home Access: Stairs to enter Entrance Stairs-Rails: Right;Left;Can reach both Home Layout: Two level;Able to live on main level with bedroom/bathroom Home Equipment: Shower seat;Grab bars - tub/shower      Prior Function Level of Independence: Independent      Comments: Ind amb without an AD community distances with 2 falls in the last 6 months secondary to "lose my balance"; Ind with ADLs   PT Goals (current goals can now be found in the care plan section) Acute Rehab PT Goals Patient Stated Goal: To walk better and get home Progress towards PT goals: Progressing toward goals    Frequency    BID      PT Plan Current plan remains appropriate    Co-evaluation              AM-PAC PT "6 Clicks" Mobility   Outcome Measure  Help needed turning from your back to your side while in a flat bed without using bedrails?: A Little Help needed moving from lying on your back to sitting on the side of a flat bed without using bedrails?: A Little Help needed moving to and from a bed to a chair (including a wheelchair)?: A Little Help needed standing up from a chair using your arms (e.g., wheelchair or bedside chair)?: A Little Help needed to walk in hospital room?: A Lot Help needed climbing 3-5 steps with a railing? : Total 6 Click Score: 15    End of Session Equipment Utilized During Treatment: Gait belt;Oxygen Activity Tolerance: Patient tolerated treatment well Patient left: with call bell/phone  within reach;in bed;with bed alarm set;Other (comment)(Pt declined up in chair) Nurse Communication: Mobility status PT Visit Diagnosis: Unsteadiness on feet (R26.81);History of falling (Z91.81);Other abnormalities of gait and mobility (R26.89);Muscle weakness (generalized) (M62.81);Pain Pain - Right/Left: Right Pain - part of body: Ankle and joints of foot     Time: 8588-5027 PT Time Calculation (min) (ACUTE ONLY): 23 min  Charges:  $Gait Training: 8-22 mins $Therapeutic Exercise: 8-22 mins                     D. Scott Raegyn Renda PT, DPT 09/04/19, 1:22 PM

## 2019-09-04 NOTE — Evaluation (Signed)
Occupational Therapy Evaluation Patient Details Name: Jamie Roman MRN: [id number] DOB: [birth year] Today's Date: 09/04/2019    History of Present Illness Pt is a 59 y.o. female who presented to the ED with right ankle pain after fall at home. 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 Patient diagnosed with R closed bimalleolar ankle fracture and is s/p ORIF.   Clinical Impression   Pt seen for OT evaluation this date. Pt was independent in all ADL and functional mobility, no prior home O2 use. Pt now has NWBing restrictions for RLE 2/2 ankle fx s/p surgical repair. Pt has 24/7 assist at home. Pt denies pain and SOB during session. Pt Mod indep for bed mobility, CGA for ADL transfers and amb in room with RW and able to maintain NWBing to RLE throughout. On 3L O2 throughout, O2 sats ranging from 91% with exertion up to 95% at rest, HR 66-81. Pt instructed in energy conservation strategies, pursed lip breathing, AE/DME and adaptive strategies for ADL given WB'ing precautions, and home O2 mgt strategies. Pt would continue to benefit from skilled OT services to maximize safety, falls prevention, return to PLOF, and minimize risk of falls, caregiver burden, and readmission. Upon discharge, recommend [place] services, pending further demonstration that pt can maintain her O2 sats during exertional ADL/mobility.    Follow Up Recommendations  Home health OT    Equipment Recommendations  3 in 1 bedside commode    Recommendations for Other Services       Precautions / Restrictions Precautions Precautions: Fall Precaution Comments: watch O2 sats w/ exertion Required Braces or Orthoses: Splint/Cast Splint/Cast: RLE Restrictions Weight Bearing Restrictions: Yes RLE Weight Bearing: Non weight bearing      Mobility Bed Mobility Overal  bed mobility: Modified Independent             General bed mobility comments: Extra time and effort required  Transfers Overall transfer level: Needs assistance Equipment used: Rolling walker (2 wheeled) Transfers: Sit to/from Stand Sit to Stand: Min guard              Balance Overall balance assessment: Needs assistance   Sitting balance-Leahy Scale: Normal     Standing balance support: Bilateral upper extremity supported Standing balance-Leahy Scale: Fair                             ADL either performed or assessed with clinical judgement   ADL Overall ADL's : Needs assistance/impaired     Grooming: Wash/dry face;Wash/dry hands;Standing;Supervision/safety Grooming Details (indicate cue type and reason): cues for O2 mgt, PLB Upper Body Bathing: Sitting;Set up;Supervision/ safety   Lower Body Bathing: Sit to/from stand;Min guard Lower Body Bathing Details (indicate cue type and reason): CGA for ADL transfer Upper Body Dressing : Sitting;Modified independent   Lower Body Dressing: Sit to/from stand;Min guard   Toilet Transfer: RW;Ambulation;BSC;Min Psychiatric nurse Details (indicate cue type and reason): CGA for PLB         Functional mobility during ADLs: Min guard;Rolling walker       Vision Baseline Vision/History: No visual deficits Patient Visual Report: No change from baseline       Perception     Praxis      Pertinent Vitals/Pain Pain Assessment: No/denies pain     Hand Dominance  Extremity/Trunk Assessment Upper Extremity Assessment Upper Extremity Assessment: Generalized weakness   Lower Extremity Assessment Lower Extremity Assessment: Generalized weakness;RLE deficits/detail RLE: Unable to fully assess due to immobilization       Communication Communication Communication: No difficulties   Cognition Arousal/Alertness: Awake/alert Behavior During Therapy: WFL for tasks assessed/performed Overall Cognitive  Status: Within Functional Limits for tasks assessed                                     General Comments  EOB O2 93% and HR 68; after standing at sink for grooming tasks ~38min, 91% O2, HR 81; once in recliner with rest/PLB, O2 95% and HR 66; on 3L throughout    Exercises Other Exercises Other Exercises: Pt instructed in energy conservation strategies, pursed lip breathing, AE/DME and adaptive strategies for ADL given WB'ing precautions, and home O2 mgt strategies   Shoulder Instructions      Home Living Family/patient expects to be discharged to:: Private residence Living Arrangements: Children Available Help at Discharge: Family;Available 24 hours/day Type of Home: House Home Access: Stairs to enter [employer] of Steps: 3 Entrance Stairs-Rails: Right;Left;Can reach both Home Layout: Two level;Able to live on main level with bedroom/bathroom     Bathroom Shower/Tub: Walk-in shower;Door   [employer] Toilet: Standard     Home Equipment: Shower seat;Grab bars - tub/shower          Prior Functioning/Environment Level of Independence: Independent        Comments: Ind amb without an AD community distances with 2 falls in the last 6 months secondary to "lose my balance"; Ind with ADLs        OT Problem List: Decreased strength;Decreased activity tolerance;Decreased range of motion;Impaired balance (sitting and/or standing)      OT Treatment/Interventions: Self-care/ADL training;Therapeutic exercise;Therapeutic activities;Energy conservation;DME and/or AE instruction;Patient/family education;Balance training    OT Goals(Current goals can be found in the care plan section) Acute Rehab OT Goals Patient Stated Goal: To walk better and get home OT Goal Formulation: With patient Time For Goal Achievement: 09/18/19 Potential to Achieve Goals: Good ADL Goals Pt Will Transfer to Toilet: with modified independence;ambulating;bedside commode(BSC over  toilet, LRAD for amb, RLE NWBing, O2 >90% on suppl O2) Additional ADL Goal #1: Pt will verbalize plan to implement at least 2 learned energy conservation strategies. Additional ADL Goal #2: Pt will independently instruct family/caregivers in home O2 mgt to maximize safety  OT Frequency: Min 1X/week   Barriers to D/C:            Co-evaluation              AM-PAC OT "6 Clicks" Daily Activity     Outcome Measure Help from another person eating meals?: None Help from another person taking care of personal grooming?: A Little Help from another person toileting, which includes using toliet, bedpan, or urinal?: A Little Help from another person bathing (including washing, rinsing, drying)?: A Little Help from another person to put on and taking off regular upper body clothing?: None Help from another person to put on and taking off regular lower body clothing?: A Little 6 Click Score: 20   End of Session Equipment Utilized During Treatment: Gait belt;Rolling walker;Oxygen  Activity Tolerance: Patient tolerated treatment well Patient left: in chair;with call bell/phone within reach;with chair alarm set;with SCD's reapplied  OT Visit Diagnosis: Other abnormalities of gait and mobility (R26.89);Muscle weakness (generalized) (M62.81)  Time: 3474-2595 OT Time Calculation (min): 26 min Charges:  OT General Charges $OT Visit: 1 Visit OT Evaluation $OT Eval Low Complexity: 1 Low OT Treatments $Self Care/Home Management : 8-22 mins  Richrd Prime, MPH, MS, OTR/L ascom [phone] 09/04/19, 9:35 AM

## 2019-09-04 NOTE — Discharge Summary (Signed)
Jamie Roman UJW:[id number] DOB: 1961/02/24 DOA: 08/30/2019  PCP: [facility]  Admit date: 08/30/2019 Discharge date: 09/04/2019  Admitted From: Home Disposition: Home  Recommendations for Outpatient Follow-up:  1. Follow up with PCP in 1 week 2. Please obtain BMP/CBC in one week 3. Please follow up on the following pending results:  Home Health: Yes   Discharge Condition:Stable CODE STATUS:Full  Diet recommendation: Heart Healthy Brief/Interim Summary: Per H&P admit note Jamie Roman is a 59 y.o. female with medical history significant of hypertension, COPD not on home O2, current smoker, h/o type B aortic dissection s/p TEVAR in 2013, h/[place] ile[place] [address] for ischemic descending colon and takedown in 2019, presented to the ED with right ankle pain after fall at home.apparently daughter had reported at baseline patient'S O2 at 89 to 90%. She should be on home O2 but not on. not compliant with following her pulmonologist at [facility].Chest x-ray was negative, COVID-19 was negative. CTA chest ruled out PE. She was found to have tib-fib fracture of the right ankle on x-ray. Orthopedics was consulted and patient admitted to hospitalist service due to hypoxia. Patient was subsequently on BiPAP. Has since been weaned off and is down to 2 L/min oxygen by nasal cannula as of this morning. CTA chest did show right middle lobe collapse likely due to mucous plugging, as well as bronchial wall thickening at the bilateral bases. Pulmonary(Dr. Lovell Sheehan consulted for possible bronchoscopy. Patient was started on antibiotics IV.  He was started on inhalers.  Salted and she underwent status post ORIF of the right bimalleolar ankle fracture on 09/01/19.  PT OT was consulted.  It was recommended for patient to go to SNF however she declined.  From orthopedic standpoint she is okay to be discharged home.  She will need physical therapy at home.  She will be nonweightbearing on the  right lower extremity for 6 weeks postop.  Ortho recommended Lovenox 40 mg daily will need to follow-up with Dr. Martha Clan in 10 days.,  Pulmonology did see the patient and he stated wanted to do endobronchial exam as outpatient and she can follow-up with her pulmonologist as outpatient at [facility] which the daughter stated to me today has an appointment February 16.  Dr. Park Breed also stated discharge patient on a long prednisone taper and she will require 3 L of oxygen.  I had an extensive discussion with the patient about oxygen therapy that she requires it however she needs to stop smoking.  She did tell me this a.m. she is interested in stopping.  I also told her that she cannot smoke while on oxygen and she can be at no one that smokes can be around oxygen there will be fire and combustion and can cause burns and death and even set the house on fire.  She verbalizes complete understanding of this.  I spoke to patient's daughter [name] about this also and her daughter states that they do not smoke near her.  And they are aware while patient is on oxygen she needs to quit smoking and she cannot under any circumstances be smoking next to it.  Everybody verbally verbalized complete understanding of this. Yesterday patient had 15 beats of VT cardiology was consulted.  Echocardiogram revealed normal EF.  She declined ischemic work-up and would like to do the stress test as outpatient.  She again declined the stress test or any ischemic work-up as inpatient this a.m. when I spoke to her.  She sees cardiology in  [facility] I spoke to her and her daughter that she will need to follow-up next week as soon as possible for further evaluation of this.  They are aware that VT can be dangerous and important to f/u about this. She was asymptomatic when she had the VT.  She will be discharged home with home health today.  Discharge Diagnoses:  Principal Problem:   Bimalleolar ankle fracture, right, closed, initial encounter Active  Problems:   HTN (hypertension), benign   COPD with acute exacerbation (HCC)   Acute respiratory failure with hypoxia (HCC)   Aneurysm of thoracic aorta (HCC)   VT (ventricular tachycardia) [facility])    Discharge Instructions  Discharge Instructions    Call MD for:  difficulty breathing, headache or visual disturbances   Complete by: As directed    Call MD for:  temperature >100.4   Complete by: As directed    Diet - low sodium heart healthy   Complete by: As directed    Discharge instructions   Complete by: As directed    Follow-up with your primary pulmonologist at [facility] on February 16 as her daughter has made the appointment Follow-up with cardiology in 1 week at [facility] Follow-up your primary care in 1 week NEED TO QUIT SMOKING CANNOT SMOKE WHILE ON OXYGEN AS IT WILL CAUSE FIRE   Increase activity slowly   Complete by: As directed      Allergies as of 09/04/2019   No Known Allergies     Medication List    STOP taking these medications   loratadine 10 MG tablet Commonly known as: CLARITIN     TAKE these medications   acetaminophen 325 MG tablet Commonly known as: TYLENOL Take 2 tablets (650 mg total) by mouth every 6 (six) hours as needed for mild pain (or Fever >/= 101).   acidophilus Caps capsule Take 2 capsules by mouth 2 (two) times daily.   albuterol 108 (90 Base) MCG/ACT inhaler Commonly known as: VENTOLIN HFA Inhale 2 puffs into the lungs every 6 (six) hours as needed for wheezing or shortness of breath.   amLODipine 10 MG tablet Commonly known as: NORVASC Take 1 tablet (10 mg total) by mouth daily.   Aspirin Low Dose 81 MG EC tablet Generic drug: aspirin Take 81 mg by mouth daily.   atorvastatin 40 MG tablet Commonly known as: LIPITOR Take 40 mg by mouth at bedtime.   carvedilol 25 MG tablet Commonly known as: COREG Take 25 mg by mouth 2 (two) times daily.   doxycycline 100 MG tablet Commonly known as: VIBRA-TABS Take 1 tablet (100 mg total) by  mouth every 12 (twelve) hours.   enoxaparin 40 MG/0.4ML injection Commonly known as: LOVENOX Inject 0.4 mLs (40 mg total) into the skin daily for 14 days.   guaiFENesin 600 MG 12 hr tablet Commonly known as: MUCINEX Take 1 tablet (600 mg total) by mouth 2 (two) times daily.   hydrochlorothiazide 12.5 MG capsule Commonly known as: MICROZIDE Take 12.5 mg by mouth daily.   losartan 100 MG tablet Commonly known as: COZAAR Take 100 mg by mouth daily.   pantoprazole 20 MG tablet Commonly known as: PROTONIX Take 1 tablet (20 mg total) by mouth daily. Start taking on: September 05, 2019   predniSONE 20 MG tablet Commonly known as: DELTASONE Take 2 tablets (40 mg total) by mouth daily with breakfast for 2 doses. Start taking on: September 05, 2019   predniSONE 10 MG tablet Commonly known as: DELTASONE Take 3 tablets (  30 mg total) by mouth daily for 3 days. Start taking on: September 08, 2019   predniSONE 20 MG tablet Commonly known as: Deltasone Take 1 tablet (20 mg total) by mouth daily for 3 days. Start taking on: September 11, 2019   Symbicort 80-4.5 MCG/ACT inhaler Generic drug: budesonide-formoterol Inhale 2 puffs into the lungs 2 (two) times daily.            Durable Medical Equipment  (From admission, onward)         Start     Ordered   09/04/19 1139  For home use only DME oxygen  Once    Question Answer Comment  Length of Need Lifetime   Mode or (Route) Nasal cannula   Liters per Minute 3   Frequency Continuous (stationary and portable oxygen unit needed)   Oxygen conserving device Yes   Oxygen delivery system Gas      09/04/19 1140   09/04/19 0900  For home use only DME Bedside commode  Once    Question:  Patient needs a bedside commode to treat with the following condition  Answer:  Weakness   09/04/19 0900   09/03/19 1220  For home use only DME lightweight manual wheelchair with seat cushion  Once    Comments: Patient suffers from weakness, COPD which  impairs their ability to perform daily activities like ADLs in the home.  A walking aid will not resolve  issue with performing activities of daily living. A wheelchair will allow patient to safely perform daily activities. Patient is not able to propel themselves in the home using a standard weight wheelchair due to weakness. Patient can self propel in the lightweight wheelchair. Length of need 99 months. Accessories: elevating leg rests ELRs, wheel locks, extensions and anti-tippers.and Back Cushion   09/03/19 1222         Follow-up Information    [facility] Follow up in 1 week(s).   Contact information: [address] [place] [address] [phone]        Juanell Fairly, MD Follow up in 10 day(s).   Specialty: Orthopedic Surgery Contact information: [address] [place] [address] [phone]          No Known Allergies  Consultations:  Pulmonary  Orthopedic  cardiology   Procedures/Studies: DG Chest 2 View  Result Date: 09/02/2019 CLINICAL DATA:  Dyspnea. EXAM: CHEST - 2 VIEW COMPARISON:  August 30, 2019. FINDINGS: Stable cardiomegaly. Stable position of thoracic stent graft. No pneumothorax is noted. Left lung is clear. Mild right pleural effusion is noted with associated atelectasis or infiltrate. Bony thorax is unremarkable. IMPRESSION: Mild right pleural effusion is noted with associated atelectasis or infiltrate. Electronically Signed   By: Lupita Raider M.D.   On: 09/02/2019 15:22   DG Tibia/Fibula Right  Result Date: 08/30/2019 CLINICAL DATA:  Pain after fall. Deformity right ankle. EXAM: RIGHT TIBIA AND FIBULA - 2 VIEW COMPARISON:  None. FINDINGS: Ankle fractures are identified. Please see the ankle films for full description. The remainder of the tibia and fibular intact. IMPRESSION: Ankle fractures are identified. These will be described on the dedicated ankle films. The remainder of the tibia and fibular intact. The  distal femur is intact. Electronically Signed   By: Gerome Sam III M.D   On: 08/30/2019 14:56   DG Ankle Complete Right  Result Date: 08/30/2019 CLINICAL DATA:  Pain after fall EXAM: RIGHT ANKLE - COMPLETE 3+ VIEW COMPARISON:  None. FINDINGS: A displaced  distal fibular fracture is identified. And mildly displaced comminuted medial malleolar fracture is identified. A linear soft tissue calcification is seen anterior to the talar neck, likely displaced fragment from the ankle fractures. The talus itself appears intact. Punctate foci of high attenuation are projected in the soft tissues adjacent to the talus, age indeterminate. IMPRESSION: 1. Displaced fractures of the distal fibula and medial malleolus. The medial malleolar fracture is comminuted. A linear calcification in the soft tissues anterior to the talus is likely a displaced fracture fragment associated with the ankle fractures. Soft tissue swelling is identified. 2. Punctate foci of high attenuation in the soft tissues adjacent to the talus are consistent with foreign bodies, age indeterminate. Electronically Signed   By: Gerome Samavid  Williams III M.D   On: 08/30/2019 14:58   CT Angio Chest PE W and/or Wo Contrast  Result Date: 08/30/2019 CLINICAL DATA:  Shortness of breath chest pain. EXAM: CT ANGIOGRAPHY CHEST WITH CONTRAST TECHNIQUE: Multidetector CT imaging of the chest was performed using the standard protocol during bolus administration of intravenous contrast. Multiplanar CT image reconstructions and MIPs were obtained to evaluate the vascular anatomy. CONTRAST:  75mL OMNIPAQUE IOHEXOL 350 MG/ML SOLN COMPARISON:  None. FINDINGS: Cardiovascular: Contrast injection is sufficient to demonstrate satisfactory opacification of the pulmonary arteries to the segmental level. There is no pulmonary embolus. The main pulmonary artery is significantly dilated measuring approximately 3.9 cm in diameter. There is no CT evidence of acute right heart strain.  The patient is status post aortic stent graft placement involving the descending thoracic aorta. The stent graft appears to be grossly patent. At the level of the distal descending thoracic aorta, there is a questionable filling defect (axial series 5, image 235). There is a partially thrombosed false lumen with the aorta at this level measuring up to approximately 5.3 cm in diameter on the coronal series (series 7, image 49). Heart size is enlarged. Coronary artery calcifications are noted. Thoracic aortic calcifications are noted. Mediastinum/Nodes: --No mediastinal or hilar lymphadenopathy. There are few slightly prominent distal paraesophageal lymph nodes unknown clinical significance. --No axillary lymphadenopathy. --No supraclavicular lymphadenopathy. --Normal thyroid gland. --The esophagus is unremarkable Lungs/Pleura: Emphysematous changes are noted bilaterally. There is atelectasis at the right lung base. There is a small amount of atelectasis at the left lung base. There is complete collapse of the right middle lobe. There is a probable filling defect within the proximal right middle lobe bronchus. The trachea is otherwise unremarkable. There is some mild bilateral bronchial wall thickening and mucus plugging at the lung bases. Upper Abdomen: No acute abnormality. Musculoskeletal: No chest wall abnormality. No acute or significant osseous findings. Review of the MIP images confirms the above findings. IMPRESSION: 1. No evidence for an acute pulmonary embolism. 2. The patient is status post prior placement of a thoracic aortic endograft, perhaps related to a prior traumatic injury or dissection. The graft is grossly patent. 3. Intraluminal filling defect at the level of the distal descending thoracic aorta. This is felt to represent mixing artifact from the adjacent partially thrombosed false lumen. Intraluminal thrombus is difficult to entirely exclude on this study secondary to contrast timing. 4.  Aneurysmal descending thoracic aorta measuring up to approximately 5.3 cm. 5. Complete collapse of the right middle lobe. There is a probable filling defect within the proximal right middle lobe bronchus (favored to represent secretions or debris). Consider endobronchial evaluation as clinically indicated. 6. There is some mucous plugging and bronchial wall thickening at the lung bases bilaterally suggestive  of reactive or infectious bronchiolitis. 7. Dilated main pulmonary artery which can be seen in patients with elevated pulmonary artery pressures. 8. Cardiomegaly. 9. Aortic Atherosclerosis (ICD10-I70.0) and Emphysema (ICD10-J43.9). Electronically Signed   By: Katherine Mantle M.D.   On: 08/30/2019 19:33   DG Chest Portable 1 View  Result Date: 08/30/2019 CLINICAL DATA:  Fall.  Difficulty breathing. EXAM: PORTABLE CHEST 1 VIEW COMPARISON:  July 26, 2012 FINDINGS: No pneumothorax. A left-sided rib fractures unchanged since 2013, nonacute. Cardiomediastinal silhouette is normal. Mild haziness over the right base is favored to be due to patient rotation. No suspicious infiltrates. A stent is seen in the descending thoracic aorta, new since 2013. No other acute abnormalities. IMPRESSION: No active disease. Electronically Signed   By: Gerome Sam III M.D   On: 08/30/2019 13:59   DG Ankle Right Port  Result Date: 09/01/2019 CLINICAL DATA:  59 year old female status post internal fixation of right ankle fractures. EXAM: PORTABLE RIGHT ANKLE - 2 VIEW COMPARISON:  Right ankle radiograph dated 08/30/2019. FINDINGS: Interval placement of 2 fixation screws through the fracture of the medial malleolus and internal fixation of distal fibular fracture with side plate and screws. The ankle mortise is in near anatomic alignment. There is soft tissue swelling of the ankle. Cutaneous staples noted. There is a cast overlying the ankle. IMPRESSION: Status post internal fixation of the previously seen fractures.  Electronically Signed   By: Elgie Collard M.D.   On: 09/01/2019 18:20   ECHOCARDIOGRAM COMPLETE  Result Date: 09/01/2019   ECHOCARDIOGRAM REPORT   Patient Name:   Jarold Song Date of Exam: 09/01/2019 Medical Rec #:  [id number]           Height:       62.2 in Accession #:    [id number]          Weight:       160.3 lb Date of Birth:  June 16, 1961            BSA:          1.74 m Patient Age:    59 years            BP:           158/70 mmHg Patient Gender: F                   HR:           59 bpm. Exam Location:  [facility] Procedure: 2D Echo, Cardiac Doppler and Color Doppler Indications:     Murmur 785.2  History:         Patient has no prior history of Echocardiogram examinations.                  Stroke, Signs/Symptoms:Shortness of Breath; Risk Factors:Sleep                  Apnea.  Sonographer:     Cristela Blue RDCS (AE) Referring Phys:  [phone] CHRISTOPHER END Diagnosing Phys: Yvonne Kendall MD IMPRESSIONS  1. Left ventricular ejection fraction, by visual estimation, is 60 to 65%. The left ventricle has normal function. There is moderately increased left ventricular hypertrophy.  2. Elevated left atrial pressure.  3. Left ventricular diastolic parameters are consistent with Grade I diastolic dysfunction (impaired relaxation).  4. The left ventricle has no regional wall motion abnormalities.  5. Global right ventricle has normal systolic function.The right ventricular size is normal. Mildly increased right ventricular wall thickness.  6. Left  atrial size was severely dilated.  7. Right atrial size was mildly dilated.  8. The mitral valve is degenerative. Mild mitral valve regurgitation. No evidence of mitral stenosis.  9. The tricuspid valve is normal in structure. 10. The tricuspid valve is normal in structure. Tricuspid valve regurgitation is trivial. 11. Aortic valve regurgitation is moderate. 12. The aortic valve is tricuspid. Aortic valve regurgitation is moderate. Mild aortic valve stenosis. 13. Pulmonic  regurgitation is mild. 14. The pulmonic valve was not well visualized. Pulmonic valve regurgitation is mild. 15. 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 16. TR signal is inadequate for assessing pulmonary artery systolic pressure. 17. The inferior vena cava is normal in size with greater than 50% respiratory variability, suggesting right atrial pressure of 3 mmHg. 18. Question small interatrial shunt by color Doppler. FINDINGS  Left Ventricle: Left ventricular ejection fraction, by visual estimation, is 60 to 65%. The left ventricle has normal function. The left ventricle has no regional wall motion abnormalities. The left ventricular internal cavity size was the left ventricle is normal in size. There is moderately increased left ventricular hypertrophy. Left ventricular diastolic parameters are consistent with Grade I diastolic dysfunction (impaired relaxation). Elevated left atrial pressure. Right Ventricle: The right ventricular size is normal. Mildly increased right ventricular wall thickness. Global RV systolic function is has normal systolic function. Left Atrium: Left atrial size was severely dilated. Right Atrium: Right atrial size was mildly dilated Pericardium: There is no evidence of pericardial effusion. Mitral Valve: The mitral valve is degenerative in appearance. There is mild thickening of the mitral valve leaflet(s). Mild mitral valve regurgitation. No evidence of mitral valve stenosis by observation. Tricuspid Valve: The tricuspid valve is normal in structure. Tricuspid valve regurgitation is trivial. Aortic Valve: The aortic valve is tricuspid. . There is mild thickening and mild calcification of the aortic valve. Aortic valve regurgitation is moderate. Aortic regurgitation PHT measures 471 msec. Mild aortic stenosis is present. There is mild thickening of the aortic valve. There is mild calcification of the aortic valve.  Aortic valve mean gradient measures 12.3 mmHg. Aortic valve peak gradient measures 23.2 mmHg. Aortic valve area, by VTI measures 1.71 cm. Pulmonic Valve: The pulmonic valve was not well visualized. Pulmonic valve regurgitation is mild. Pulmonic regurgitation is mild. No evidence of pulmonic stenosis. Aorta: The aortic root is normal in size and structure. 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 Venous: The inferior vena cava is normal in size with greater than 50% respiratory variability, suggesting right atrial pressure of 3 mmHg. IAS/Shunts: Question small interatrial shunt by color Doppler. Agitated saline contrast was given intravenously to evaluate for intracardiac shunting. Saline contrast bubble study was negative, with no evidence of any interatrial shunt.  LEFT VENTRICLE PLAX 2D LVIDd:         4.90 cm  Diastology LVIDs:         2.73 cm  LV e' lateral:   6.09 cm/s LV PW:         1.44 cm  LV E/e' lateral: 12.4 LV IVS:        1.07 cm  LV e' medial:    4.90 cm/s LVOT diam:     2.00 cm  LV E/e' medial:  15.4 LV SV:         85 ml LV SV Index:   46.99 LVOT Area:     3.14 cm  RIGHT VENTRICLE  RV Basal diam:  3.59 cm RV S prime:     13.20 cm/s TAPSE (M-mode): 3.2 cm LEFT ATRIUM              Index       RIGHT ATRIUM           Index LA diam:        5.10 cm  2.92 cm/m  RA Area:     21.60 cm LA Vol (A2C):   138.0 ml 79.13 ml/m RA Volume:   59.00 ml  33.83 ml/m LA Vol (A4C):   90.8 ml  52.06 ml/m LA Biplane Vol: 112.0 ml 64.22 ml/m  AORTIC VALVE                    PULMONIC VALVE AV Area (Vmax):    1.81 cm     PV Vmax:        1.17 m/s AV Area (Vmean):   1.96 cm     PV Peak grad:   5.5 mmHg AV Area (VTI):     1.71 cm     RVOT Peak grad: 8 mmHg AV Vmax:           240.67 cm/s AV Vmean:          159.333 cm/s AV VTI:            0.545 m AV Peak Grad:      23.2 mmHg AV Mean Grad:      12.3 mmHg LVOT Vmax:         139.00 cm/s LVOT Vmean:         99.600 cm/s LVOT VTI:          0.297 m LVOT/AV VTI ratio: 0.54 AI PHT:            471 msec  AORTA Ao Root diam: 3.50 cm MITRAL VALVE MV Area (PHT): 2.66 cm              SHUNTS MV PHT:        82.65 msec            Systemic VTI:  0.30 m MV Decel Time: 285 msec              Systemic Diam: 2.00 cm MV E velocity: 75.70 cm/s  103 cm/s MV A velocity: 118.00 cm/s 70.3 cm/s MV E/A ratio:  0.64        1.5  Cristal Deer End MD Electronically signed by Yvonne Kendall MD Signature Date/Time: 09/01/2019/2:18:13 PM    Final       Subjective: Pt feeling better from pulmonary stand point.  No new complaints  Discharge Exam: Vitals:   09/04/19 0937 09/04/19 1036  BP:  (!) 156/88  Pulse:  71  Resp:  18  Temp: 99.1 F (37.3 C)   SpO2:     Vitals:   09/04/19 0829 09/04/19 0833 09/04/19 0937 09/04/19 1036  BP: (!) 182/71   (!) 156/88  Pulse: 67   71  Resp: 18   18  Temp:   99.1 F (37.3 C)   TempSrc:   Oral   SpO2: 100% 98%    Weight:      Height:        General: Pt is alert, awake, not in acute distress Cardiovascular: RRR, S1/S2 +, no rubs, no gallops Respiratory: CTA bilaterally, no wheezing, no rhonchi Abdominal: Soft, NT, ND, bowel sounds + Extremities: no edema, no cyanosis    The results of significant  diagnostics from this hospitalization (including imaging, microbiology, ancillary and laboratory) are listed below for reference.     Microbiology: Recent Results (from the past 240 hour(s))  SARS CORONAVIRUS 2 (TAT 6-24 HRS)     Status: None   Collection Time: 08/30/19  3:47 PM  Result Value Ref Range Status   SARS Coronavirus 2 NEGATIVE NEGATIVE Final    Comment: (NOTE) SARS-CoV-2 target nucleic acids are NOT DETECTED. The SARS-CoV-2 RNA is generally detectable in upper and lower respiratory specimens during the acute phase of infection. Negative results do not preclude SARS-CoV-2 infection, do not rule out co-infections with other pathogens, and should not be used as the sole  basis for treatment or other patient management decisions. Negative results must be combined with clinical observations, patient history, and epidemiological information. The expected result is Negative. Fact Sheet for Patients: SugarRoll.be Fact Sheet for Healthcare Providers: https://www.woods-mathews.com/ This test is not yet approved or cleared by the [place] FDA and  has been authorized for detection and/or diagnosis of SARS-CoV-2 by FDA under an Emergency Use Authorization (EUA). This EUA will remain  in effect (meaning this test can be used) for the duration of the COVID-19 declaration under Section 56 4(b)(1) of the Act, 21 U.S.C. section 360bbb-3(b)(1), unless the authorization is terminated or revoked sooner. Performed at [facility], [place] [address], [place], [place] [address]   MRSA PCR Screening     Status: None   Collection Time: 08/31/19 12:16 AM   Specimen: Nasal Mucosa; Nasopharyngeal  Result Value Ref Range Status   MRSA by PCR NEGATIVE NEGATIVE Final    Comment:        The GeneXpert MRSA Assay (FDA approved for NASAL specimens only), is one component of a comprehensive MRSA colonization surveillance program. It is not intended to diagnose MRSA infection nor to guide or monitor treatment for MRSA infections. Performed at [facility], [address], [place], [place] [address]   Urine culture     Status: None   Collection Time: 08/31/19  5:38 AM   Specimen: Urine, Random  Result Value Ref Range Status   Specimen Description   Final    URINE, RANDOM Performed at [facility], [address], [place], [place] [address]    Special Requests   Final    NONE Performed at [facility], [address], [place], [place] [address]    Culture   Final    NO GROWTH Performed at [facility], [place] [address], [place], [place] [address]    Report Status 09/01/2019 FINAL  Final      Labs: BNP (last 3 results) Recent Labs    08/30/19 1649  BNP 59.5   Basic Metabolic Panel: Recent Labs  Lab 08/30/19 1649 08/31/19 0419 09/01/19 0503 09/02/19 0605 09/04/19 0520  NA 135 133* 138 140  --   K 3.6 4.1 4.3 4.3  --   CL 98 98 103 103  --   CO2 26 29 26 29   --   GLUCOSE 76 142* 167* 91  --   BUN 33* 36* 33* 24*  --   CREATININE 1.42* 1.28* 1.02* 1.03* 0.76  CALCIUM 8.6* 8.5* 8.9 8.8*  --   MG  --   --  2.1 2.6*  --    Liver Function Tests: Recent Labs  Lab 08/30/19 1649  AST 31  ALT 15  ALKPHOS 87  BILITOT 0.9  PROT 6.9  ALBUMIN 3.1*   No  results for input(s): LIPASE, AMYLASE in the last 168 hours. No results for input(s): AMMONIA in the last 168 hours. CBC: Recent Labs  Lab 08/30/19 1649 08/31/19 0419 09/01/19 0503 09/02/19 0605  WBC 9.3 5.0 7.1 7.5  NEUTROABS 5.7  --  5.1  --   HGB 12.3 12.8 11.7* 11.8*  HCT 39.0 41.2 37.3 38.8  MCV 82.8 83.4 82.9 85.7  PLT 215 209 224 228   Cardiac Enzymes: No results for input(s): CKTOTAL, CKMB, CKMBINDEX, TROPONINI in the last 168 hours. BNP: Invalid input(s): POCBNP CBG: Recent Labs  Lab 08/31/19 0012  GLUCAP 146*   D-Dimer No results for input(s): DDIMER in the last 72 hours. Hgb A1c No results for input(s): HGBA1C in the last 72 hours. Lipid Profile No results for input(s): CHOL, HDL, LDLCALC, TRIG, CHOLHDL, LDLDIRECT in the last 72 hours. Thyroid function studies No results for input(s): TSH, T4TOTAL, T3FREE, THYROIDAB in the last 72 hours.  Invalid input(s): FREET3 Anemia work up No results for input(s): VITAMINB12, FOLATE, FERRITIN, TIBC, IRON, RETICCTPCT in the last 72 hours. Urinalysis    Component Value Date/Time   COLORURINE Amber 07/25/2012 1408   COLORURINE YELLOW 01/05/2012 0135   APPEARANCEUR Cloudy 07/25/2012 1408   LABSPEC 1.043 07/25/2012 1408   PHURINE 8.0 07/25/2012 1408   PHURINE 5.5 01/05/2012 0135   GLUCOSEU 150 mg/dL 16/07/930 3557   HGBUR Negative  07/25/2012 1408   HGBUR NEGATIVE 01/05/2012 0135   BILIRUBINUR Negative 07/25/2012 1408   KETONESUR Negative 07/25/2012 1408   KETONESUR NEGATIVE 01/05/2012 0135   PROTEINUR >=500 07/25/2012 1408   PROTEINUR NEGATIVE 01/05/2012 0135   UROBILINOGEN 0.2 01/05/2012 0135   NITRITE Negative 07/25/2012 1408   NITRITE NEGATIVE 01/05/2012 0135   LEUKOCYTESUR Negative 07/25/2012 1408   Sepsis Labs Invalid input(s): PROCALCITONIN,  WBC,  LACTICIDVEN Microbiology Recent Results (from the past 240 hour(s))  SARS CORONAVIRUS 2 (TAT 6-24 HRS)     Status: None   Collection Time: 08/30/19  3:47 PM  Result Value Ref Range Status   SARS Coronavirus 2 NEGATIVE NEGATIVE Final    Comment: (NOTE) SARS-CoV-2 target nucleic acids are NOT DETECTED. The SARS-CoV-2 RNA is generally detectable in upper and lower respiratory specimens during the acute phase of infection. Negative results do not preclude SARS-CoV-2 infection, do not rule out co-infections with other pathogens, and should not be used as the sole basis for treatment or other patient management decisions. Negative results must be combined with clinical observations, patient history, and epidemiological information. The expected result is Negative. Fact Sheet for Patients: HairSlick.no Fact Sheet for Healthcare Providers: quierodirigir.com This test is not yet approved or cleared by the [place] FDA and  has been authorized for detection and/or diagnosis of SARS-CoV-2 by FDA under an Emergency Use Authorization (EUA). This EUA will remain  in effect (meaning this test can be used) for the duration of the COVID-19 declaration under Section 56 4(b)(1) of the Act, 21 U.S.C. section 360bbb-3(b)(1), unless the authorization is terminated or revoked sooner. Performed at [facility], [address], [place], [place] [address]   MRSA PCR Screening     Status: None   Collection Time:  08/31/19 12:16 AM   Specimen: Nasal Mucosa; Nasopharyngeal  Result Value Ref Range Status   MRSA by PCR NEGATIVE NEGATIVE Final    Comment:        The GeneXpert MRSA Assay (FDA approved for NASAL specimens only), is one component of a comprehensive MRSA colonization surveillance program. It  is not intended to diagnose MRSA infection nor to guide or monitor treatment for MRSA infections. Performed at [facility], [address] [place], [place] [address]   Urine culture     Status: None   Collection Time: 08/31/19  5:38 AM   Specimen: Urine, Random  Result Value Ref Range Status   Specimen Description   Final    URINE, RANDOM Performed at [facility], [address] [place], [place] [address]    Special Requests   Final    NONE Performed at [facility], [address] [place], [place] [address]    Culture   Final    NO GROWTH Performed at [facility], [address] [place], [place] [address]    Report Status 09/01/2019 FINAL  Final     Time coordinating discharge: Over 30 minutes  SIGNED:   Lynn ItoSahar Collette Pescador, MD  [facility] 09/04/2019, 1:13 PM Pager   If 7PM-7AM, please contact night-coverage [email or web address] Password [email or web address]

## 2019-09-04 NOTE — Progress Notes (Signed)
Pt is being discharged home. Discharge papers given and explained to pt and daughter, both verbalized understanding. Meds and f/u appointment reviewed. Rx sent electronically to pharmacy. Pt's daughter made aware.  Pt's daughter verbalized that she will give the Lovenox injections to pt and already familiar with how to administer. Awaiting O2 tank.

## 2019-09-04 NOTE — TOC Transition Note (Addendum)
Transition of Care [facility]) - CM/SW Discharge Note   Patient Details  Name: Jamie Roman MRN: [id number] Date of Birth: Jul 13, 1961  Transition [facility] Care [facility]) CM/SW Contact:  Barrie Dunker, RN Phone Number: 09/04/2019, 1:47 PM   Clinical Narrative:    Spoke with the patient at length about HH needs and oxygen and smoking.  She said she doesn't need oxygen that she breathes fine.  I explained that her oxygen level drops without the oxygen and that will cause dizziness and many other problems  I explained the dangers of smoking with oxygen in place.  I also had a lengthy discussion with [name] the daughter and she feels that the patient's oxygen need will get better.  She said that she can't go home on oxygen that the patient will not quit smoking and therefore if we send her home on oxygen she will take her to [facility].  I explained that with patient's with COPD the need for oxygen will increase and she will likely need it the rest of her life especially if she continues to smoke.  She stated that the patient has had this many times that when she is getting over a cold she will need oxygen for a little bit but it gets better.  I explained that may not be the case this time.  I explained that I set up oxygen and DME because the patient needs it and it is ordered by the physician.  She stated that she knows her mother and she is not going to quit smoking.  Rosey Bath with [facility] texted me and made me aware that they will not see the patient due to her smoking while using Oxygen.  [facility], [facility] declined the patient, [facility] does not have the staffing, I sent to [facility] and am awaiting an answer The patient is to get a wheelchair and Oxygen to go home on, Brad with [facility] aware that the patient has a DC order [facility] and [facility] are unable to accept the patient   The patient was made aware of the inability to get her set up with [facility], she states  that is fine and agrees to go to Outpatient PT at [facility], Referral form provided for the patient to take with her to [facility] to see PT there  Final next level of care: Home w Home Health Services Barriers to Discharge: Barriers Resolved   Patient Goals and CMS Choice Patient states their goals for this hospitalization and ongoing recovery are:: Go home with her daughter      Discharge Placement                       Discharge Plan and Services   Discharge Planning Services: CM Consult            DME Arranged: 3-N-1, Oxygen, Walker rolling DME Agency: [facility] Date DME Agency Contacted: 09/03/19 Time DME Agency Contacted: [phone] Representative spoke with at DME Agency: Nida Boatman HH Arranged: PT, OT, RN, Nurse's Aide HH Agency: [facility] Date HH Agency Contacted: 09/03/19 Time HH Agency Contacted: 1000 Representative spoke with at [facility] Agency: Rosey Bath  Social Determinants of Health (SDOH) Interventions     Readmission Risk Interventions No flowsheet data found.

## 2019-09-04 NOTE — Progress Notes (Signed)
Progress Note  Patient Name: Jamie Roman Date of Encounter: 09/04/2019  Primary Cardiologist: dr. Okey Dupre  Subjective   Telemetry reviewed, no further nonsustained VT Last episode 24 hours earlier 15 beat Rare PVCs Records tell today, discussed whether she would like ischemic work-up performed as inpatient She is indicated she does not want any further testing at this time, prefers to follow-up with her outpatient cardiologist at [facility] for ischemic work-up  Inpatient Medications    Scheduled Meds: . acidophilus  2 capsule Oral BID  . amLODipine  10 mg Oral Daily  . atorvastatin  40 mg Oral QHS  . carvedilol  25 mg Oral BID  . Chlorhexidine Gluconate Cloth  6 each Topical Daily  . docusate sodium  100 mg Oral BID  . doxycycline  100 mg Oral Q12H  . enoxaparin (LOVENOX) injection  40 mg Subcutaneous Q24H  . guaiFENesin  600 mg Oral BID  . hydrochlorothiazide  12.5 mg Oral Daily  . ipratropium-albuterol  3 mL Nebulization TID  . loratadine  10 mg Oral Daily  . losartan  100 mg Oral Daily  . mometasone-formoterol  2 puff Inhalation BID  . pantoprazole  20 mg Oral Daily  . predniSONE  40 mg Oral Q breakfast  . senna  1 tablet Oral BID  . sodium chloride flush  3 mL Intravenous Q12H  . traMADol  50 mg Oral Q6H   Continuous Infusions: . sodium chloride Stopped (09/03/19 2029)  . cefTRIAXone (ROCEPHIN)  IV Stopped (09/03/19 2100)  . methocarbamol (ROBAXIN) IV     PRN Meds: sodium chloride, acetaminophen **OR** acetaminophen, albuterol, alum & mag hydroxide-simeth, bisacodyl, HYDROmorphone (DILAUDID) injection, lip balm, magnesium hydroxide, methocarbamol **OR** methocarbamol (ROBAXIN) IV, ondansetron **OR** ondansetron (ZOFRAN) IV, oxyCODONE, oxyCODONE, polyethylene glycol, sodium phosphate   Vital Signs    Vitals:   09/04/19 0829 09/04/19 0833 09/04/19 0937 09/04/19 1036  BP: (!) 182/71   (!) 156/88  Pulse: 67   71  Resp: 18   18  Temp:   99.1 F (37.3 C)     TempSrc:   Oral   SpO2: 100% 98%    Weight:      Height:        Intake/Output Summary (Last 24 hours) at 09/04/2019 1215 Last data filed at 09/04/2019 0438 Gross per 24 hour  Intake 202.15 ml  Output 200 ml  Net 2.15 ml   Last 3 Weights 08/31/2019 08/30/2019 01/04/2012  Weight (lbs) 160 lb 4.4 oz 150 lb 156 lb 12 oz  Weight (kg) 72.7 kg 68.04 kg 71.1 kg      Telemetry    Normal sinus rhythm, rare PVC personally Reviewed  ECG    Personally Reviewed  Physical Exam   Constitutional:  oriented to person, place, and time. No distress.  HENT:  Head: Normocephalic and atraumatic.  Eyes:  no discharge. No scleral icterus.  Neck: Normal range of motion. Neck supple. No JVD present.  Cardiovascular: Normal rate, regular rhythm, normal heart sounds and intact distal pulses. Exam reveals no gallop and no friction rub. No edema No murmur heard. Pulmonary/Chest: Effort normal and breath sounds normal. No stridor. No respiratory distress.  no wheezes.  no rales.  no tenderness.  Abdominal: Soft.  no distension.  no tenderness.  Musculoskeletal: Normal range of motion.  no  tenderness or deformity. , Right leg wrapped to above the knee, brace in place Neurological:  normal muscle tone. Coordination normal. No atrophy Skin: Skin is warm and dry.  No rash noted. not diaphoretic.  Psychiatric:  normal mood and affect. behavior is normal. Thought content normal.    Labs    High Sensitivity Troponin:   Recent Labs  Lab 08/30/19 1649  TROPONINIHS 17      Chemistry Recent Labs  Lab 08/30/19 1649 08/30/19 1649 08/31/19 0419 08/31/19 0419 09/01/19 0503 09/02/19 0605 09/04/19 0520  NA 135   < > 133*  --  138 140  --   K 3.6   < > 4.1  --  4.3 4.3  --   CL 98   < > 98  --  103 103  --   CO2 26   < > 29  --  26 29  --   GLUCOSE 76   < > 142*  --  167* 91  --   BUN 33*   < > 36*  --  33* 24*  --   CREATININE 1.42*   < > 1.28*   < > 1.02* 1.03* 0.76  CALCIUM 8.6*   < > 8.5*  --  8.9  8.8*  --   PROT 6.9  --   --   --   --   --   --   ALBUMIN 3.1*  --   --   --   --   --   --   AST 31  --   --   --   --   --   --   ALT 15  --   --   --   --   --   --   ALKPHOS 87  --   --   --   --   --   --   BILITOT 0.9  --   --   --   --   --   --   GFRNONAA 40*   < > 45*   < > 59* 59* >60  GFRAA 46*   < > 53*   < > >60 >60 >60  ANIONGAP 11   < > 6  --  9 8  --    < > = values in this interval not displayed.     Hematology Recent Labs  Lab 08/31/19 0419 09/01/19 0503 09/02/19 0605  WBC 5.0 7.1 7.5  RBC 4.94 4.50 4.53  HGB 12.8 11.7* 11.8*  HCT 41.2 37.3 38.8  MCV 83.4 82.9 85.7  MCH 25.9* 26.0 26.0  MCHC 31.1 31.4 30.4  RDW 16.3* 16.2* 16.8*  PLT 209 224 228    BNP Recent Labs  Lab 08/30/19 1649  BNP 75.0     DDimer No results for input(s): DDIMER in the last 168 hours.   Radiology    DG Chest 2 View  Result Date: 09/02/2019 CLINICAL DATA:  Dyspnea. EXAM: CHEST - 2 VIEW COMPARISON:  August 30, 2019. FINDINGS: Stable cardiomegaly. Stable position of thoracic stent graft. No pneumothorax is noted. Left lung is clear. Mild right pleural effusion is noted with associated atelectasis or infiltrate. Bony thorax is unremarkable. IMPRESSION: Mild right pleural effusion is noted with associated atelectasis or infiltrate. Electronically Signed   By: Lupita Raider M.D.   On: 09/02/2019 15:22    Cardiac Studies   Echocardiogram September 01, 2019 1. Left ventricular ejection fraction, by visual estimation, is 60 to  65%. The left ventricle has normal function. There is moderately increased  left ventricular hypertrophy.  2. Elevated left atrial pressure.  3. Left ventricular diastolic parameters are  consistent with Grade I  diastolic dysfunction (impaired relaxation).  4. The left ventricle has no regional wall motion abnormalities.  5. Global right ventricle has normal systolic function Left atrial size was severely dilated.   Aortic valve regurgitation is  moderate.  Mild aortic valve stenosis.  Known descending aortic dissection is partially visualized, better  characterized on CTA of the chest.    Patient Profile     Jamie Roman is a 59 y.o. 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   Stable per surgical notes As outpatient follow-up, recommended to be nonweightbearing, PT evaluating  Nonsustained VT 15 beat run yesterday morning 10 AM, asymptomatic Normal ejection fraction Etiology unclear, possibly from severe hypoxia/severe COPD  unable to exclude ischemia She prefers outpatient ischemic work-up Continue carvedilol Given minimal arrhythmia, will not start antiarrhythmic  Pad/dissection of aorta Graft in place, noted on CT scan  COPD Smoking cessation recommended Will need chronic oxygen  CAD Noted on CT scan, proximal LAD disease Recommend follow up with outpt cardiology, smoking cessation,  Consider statin, goal LDL less than 70   Total encounter time more than 25 minutes  Greater than 50% was spent in counseling and coordination of care with the patient   For questions or updates, please contact [facility] Please consult [email or web address] for contact info under        Signed, Ida Rogue, MD  09/04/2019, 12:15 PM

## 2019-09-07 NOTE — TOC Progression Note (Signed)
Transition of Care [facility]) - Progression Note    Patient Details  Name: Jamie Roman MRN: [id number] Date of Birth: May 02, 1961  Transition of Care [facility]) CM/SW Contact  Barrie Dunker, RN Phone Number: 09/07/2019, 10:05 AM  Clinical Narrative:    Patient is 98% on RA at rest Patient dropped to 84% while ambulating on RA Rebound saturation 91% on 6 liters while ambulating   Expected Discharge Plan: Home w Home Health Services Barriers to Discharge: Barriers Resolved  Expected Discharge Plan and Services Expected Discharge Plan: Home w Home Health Services   Discharge Planning Services: CM Consult   Living arrangements for the past 2 months: Single Family Home Expected Discharge Date: 09/04/19               DME Arranged: 3-N-1, Oxygen, Walker rolling DME Agency: [facility] Date DME Agency Contacted: 09/03/19 Time DME Agency Contacted: [phone] Representative spoke with at DME Agency: Nida Boatman HH Arranged: PT, OT, RN, Nurse's Aide HH Agency: [facility] Date HH Agency Contacted: 09/03/19 Time HH Agency Contacted: 1000 Representative spoke with at [facility] Agency: Rosey Bath   Social Determinants of Health (SDOH) Interventions    Readmission Risk Interventions No flowsheet data found.

## 2020-08-12 ENCOUNTER — Other Ambulatory Visit: Payer: Medicaid Other

## 2020-08-12 DIAGNOSIS — Z20822 Contact with and (suspected) exposure to covid-19: Secondary | ICD-10-CM

## 2020-08-16 LAB — NOVEL CORONAVIRUS, NAA: SARS-CoV-2, NAA: NOT DETECTED

## 2020-09-06 ENCOUNTER — Other Ambulatory Visit: Payer: Self-pay | Admitting: Student

## 2020-09-06 ENCOUNTER — Other Ambulatory Visit: Payer: Self-pay

## 2020-09-06 ENCOUNTER — Ambulatory Visit
Admission: RE | Admit: 2020-09-06 | Discharge: 2020-09-06 | Disposition: A | Discharge status: Home / Self Care | Payer: Medicaid Other | Attending: Student | Admitting: Student

## 2020-09-06 ENCOUNTER — Ambulatory Visit
Admission: RE | Admit: 2020-09-06 | Discharge: 2020-09-06 | Disposition: A | Discharge status: Home / Self Care | Payer: Medicaid Other | Source: Ambulatory Visit

## 2020-09-06 DIAGNOSIS — J441 Chronic obstructive pulmonary disease with (acute) exacerbation: Secondary | ICD-10-CM | POA: Insufficient documentation

## 2020-09-06 DIAGNOSIS — R059 Cough, unspecified: Secondary | ICD-10-CM | POA: Insufficient documentation

## 2021-03-01 ENCOUNTER — Emergency Department
Admission: EM | Admit: 2021-03-01 | Discharge: 2021-03-02 | Disposition: A | Discharge status: Other Acute Inpatient Hospital | Payer: Medicaid Other | Attending: Emergency Medicine | Admitting: Emergency Medicine

## 2021-03-01 ENCOUNTER — Emergency Department: Payer: Medicaid Other

## 2021-03-01 ENCOUNTER — Other Ambulatory Visit: Payer: Self-pay

## 2021-03-01 DIAGNOSIS — Z95828 Presence of other vascular implants and grafts: Secondary | ICD-10-CM | POA: Insufficient documentation

## 2021-03-01 DIAGNOSIS — J449 Chronic obstructive pulmonary disease, unspecified: Secondary | ICD-10-CM | POA: Diagnosis not present

## 2021-03-01 DIAGNOSIS — Z20822 Contact with and (suspected) exposure to covid-19: Secondary | ICD-10-CM | POA: Insufficient documentation

## 2021-03-01 DIAGNOSIS — Z79899 Other long term (current) drug therapy: Secondary | ICD-10-CM | POA: Diagnosis not present

## 2021-03-01 DIAGNOSIS — I714 Abdominal aortic aneurysm, without rupture, unspecified: Secondary | ICD-10-CM

## 2021-03-01 DIAGNOSIS — I712 Thoracic aortic aneurysm, without rupture, unspecified: Secondary | ICD-10-CM

## 2021-03-01 DIAGNOSIS — I10 Essential (primary) hypertension: Secondary | ICD-10-CM | POA: Diagnosis not present

## 2021-03-01 DIAGNOSIS — R109 Unspecified abdominal pain: Secondary | ICD-10-CM | POA: Diagnosis present

## 2021-03-01 DIAGNOSIS — F17210 Nicotine dependence, cigarettes, uncomplicated: Secondary | ICD-10-CM | POA: Insufficient documentation

## 2021-03-01 DIAGNOSIS — Z7982 Long term (current) use of aspirin: Secondary | ICD-10-CM | POA: Insufficient documentation

## 2021-03-01 LAB — CBC
HCT: 32.5 % — ABNORMAL LOW (ref 36.0–46.0)
Hemoglobin: 10.5 g/dL — ABNORMAL LOW (ref 12.0–15.0)
MCH: 24.6 pg — ABNORMAL LOW (ref 26.0–34.0)
MCHC: 32.3 g/dL (ref 30.0–36.0)
MCV: 76.1 fL — ABNORMAL LOW (ref 80.0–100.0)
Platelets: 388 10*3/uL (ref 150–400)
RBC: 4.27 MIL/uL (ref 3.87–5.11)
RDW: 17.2 % — ABNORMAL HIGH (ref 11.5–15.5)
WBC: 6.9 10*3/uL (ref 4.0–10.5)
nRBC: 0 % (ref 0.0–0.2)

## 2021-03-01 LAB — COMPREHENSIVE METABOLIC PANEL
ALT: 10 U/L (ref 0–44)
AST: 22 U/L (ref 15–41)
Albumin: 3.5 g/dL (ref 3.5–5.0)
Alkaline Phosphatase: 102 U/L (ref 38–126)
Anion gap: 10 (ref 5–15)
BUN: 20 mg/dL (ref 8–23)
CO2: 31 mmol/L (ref 22–32)
Calcium: 9.9 mg/dL (ref 8.9–10.3)
Chloride: 97 mmol/L — ABNORMAL LOW (ref 98–111)
Creatinine, Ser: 0.99 mg/dL (ref 0.44–1.00)
GFR, Estimated: >60 mL/min (ref >60)
Glucose, Bld: 110 mg/dL — ABNORMAL HIGH (ref 70–99)
Potassium: 3.4 mmol/L — ABNORMAL LOW (ref 3.5–5.1)
Sodium: 138 mmol/L (ref 135–145)
Total Bilirubin: 0.6 mg/dL (ref 0.3–1.2)
Total Protein: 7.9 g/dL (ref 6.5–8.1)

## 2021-03-01 LAB — PROTIME-INR
INR: 1 (ref 0.8–1.2)
Prothrombin Time: 13 seconds (ref 11.4–15.2)

## 2021-03-01 LAB — LIPASE, BLOOD: Lipase: 41 U/L (ref 11–51)

## 2021-03-01 LAB — APTT: aPTT: 32 seconds (ref 24–36)

## 2021-03-01 LAB — TROPONIN I (HIGH SENSITIVITY): Troponin I (High Sensitivity): 19 ng/L — ABNORMAL HIGH (ref <18)

## 2021-03-01 MED ORDER — IOHEXOL 350 MG/ML SOLN
100.0000 mL | Freq: Once | INTRAVENOUS | Status: AC | PRN
  Administered 2021-03-01: 100 mL via INTRAVENOUS

## 2021-03-01 MED ORDER — OXYCODONE-ACETAMINOPHEN 5-325 MG PO TABS
1.0000 | ORAL_TABLET | Freq: Once | ORAL | Status: AC
  Administered 2021-03-01: 1 via ORAL
  Filled 2021-03-01: qty 1

## 2021-03-01 NOTE — ED Notes (Signed)
Rainbow sent to the lab.  

## 2021-03-01 NOTE — ED Notes (Signed)
Called [facility] transfer [facility]) for Jamie Cerise, MD (vascular service)

## 2021-03-01 NOTE — ED Notes (Signed)
Face sheet faxed and images powershared 

## 2021-03-01 NOTE — ED Provider Notes (Signed)
[facility] Emergency Department Provider Note  ____________________________________________   Event Date/Time   First MD Initiated Contact with Patient 03/01/21 2336     (approximate)  I have reviewed the triage vital signs and the nursing notes.   HISTORY  Chief Complaint Abdominal Pain    HPI SHAWNTE Roman is a 60 y.o. 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 (Per Dr. 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  She said that it started about 2 days ago and has been waxing and waning but has not gone away.  She describes it as sharp and aching.  She tried taking Gas-X but it did not help.  Nothing in particular makes it better and moving around seems to make a little bit worse.  She describes the pain as severe at times and mild at other times.  She denies fever, sore throat, chest pain, shortness of breath, nausea, vomiting, and dysuria.  She reports, however, that she has been feeling like her legs (bilateral) are weaker than usual.  She reports no numbness nor tingling.     Past Medical History:  Diagnosis Date   AAA (abdominal aortic aneurysm) (HCC)    Bowel obstruction (HCC)    COPD (chronic obstructive pulmonary disease) (HCC)    Headache(784.0)    Hypertension     Patient Active Problem List   Diagnosis Date Noted   VT (ventricular tachycardia) (HCC) 09/04/2019   Aneurysm of thoracic aorta (HCC) 08/31/2019   Bimalleolar ankle fracture, right, closed, initial encounter 08/30/2019   Closed right ankle fracture  08/30/2019   COPD with acute exacerbation (HCC) 08/30/2019   Acute respiratory failure with hypoxia (HCC) 08/30/2019   Alcohol dependence (HCC) 01/03/2012   HTN (hypertension), benign 01/03/2012    Past Surgical History:  Procedure Laterality Date   ABDOMINAL AORTIC ANEURYSM REPAIR     COLOSTOMY     COLOSTOMY REVERSAL     ORIF ANKLE FRACTURE Right 09/01/2019   Procedure: OPEN REDUCTION INTERNAL FIXATION (ORIF) ANKLE FRACTURE;  Surgeon: Juanell Fairly, MD;  Location: [facility] ORS;  Service: Orthopedics;  Laterality: Right;    Prior to Admission medications   Medication Sig Start Date End Date Taking? Authorizing Provider  acetaminophen (TYLENOL) 325 MG tablet Take 2 tablets (650 mg total) by mouth every 6 (six) hours as needed for mild pain (or Fever >/= 101). 09/04/19   Lynn Ito, MD  acidophilus (RISAQUAD) CAPS capsule Take 2 capsules by mouth 2 (two) times daily. 09/04/19   Lynn Ito, MD  albuterol (VENTOLIN HFA) 108 (90 Base) MCG/ACT inhaler Inhale 2 puffs into the lungs every 6 (six) hours as needed for wheezing or shortness of breath. 09/04/19   Lynn Ito, MD  amLODipine (NORVASC) 10 MG tablet Take 1 tablet (10 mg total) by mouth daily. 01/05/12 08/30/19  Viyuoh, Rolland Bimler, MD  ASPIRIN LOW DOSE 81 MG EC tablet Take 81 mg by mouth daily. 08/10/19   [provider]  atorvastatin (LIPITOR) 40 MG tablet Take 40 mg by mouth at bedtime. 08/10/19   [provider]  carvedilol (COREG) 25 MG tablet Take 25 mg by mouth 2 (two) times daily. 07/29/19   [provider]  doxycycline (VIBRA-TABS) 100 MG tablet Take 1 tablet (100 mg total) by mouth every 12 (twelve) hours. 09/04/19   Lynn Ito, MD  enoxaparin (LOVENOX) 40 MG/0.4ML injection Inject 0.4 mLs (40 mg total) into the skin daily for 14 days. 09/04/19 09/18/19  Lynn Ito, MD  guaiFENesin (MUCINEX) 600 MG 12 hr tablet Take 1 tablet (600 mg total) by mouth 2 (two) times daily. 09/04/19   Lynn Ito, MD   hydrochlorothiazide (MICROZIDE) 12.5 MG capsule Take 12.5 mg by mouth daily. 08/06/19   [provider]  losartan (COZAAR) 100 MG tablet Take 100 mg by mouth daily. 06/13/19   [provider]  pantoprazole (PROTONIX) 20 MG tablet Take 1 tablet (20 mg total) by mouth daily. 09/05/19   Lynn Ito, MD  SYMBICORT 80-4.5 MCG/ACT inhaler Inhale 2 puffs into the lungs 2 (two) times daily. 08/21/19   [provider]    Allergies Patient has no known allergies.  History reviewed. No pertinent family history.  Social History Social History   Tobacco Use   Smoking status: Every Day    Packs/day: 0.50    Years: 15.00    Pack years: 7.50    Types: Cigarettes   Smokeless tobacco: Never  Substance Use Topics   Alcohol use: Yes    Comment: 1 quart    Review of Systems Constitutional: No fever/chills Eyes: No visual changes. ENT: No sore throat. Cardiovascular: Denies chest pain. Respiratory: Denies shortness of breath. Gastrointestinal: Positive for lower abdominal pain x2 days. Genitourinary: Negative for dysuria. Musculoskeletal: Positive for subjective bilateral leg weakness.  Negative for neck pain.  Positive for occasional back pain. Integumentary: Negative for rash. Neurological: Negative for headaches, focal weakness or numbness.   ____________________________________________   PHYSICAL EXAM:  VITAL SIGNS: ED Triage Vitals  Enc Vitals Group     BP 03/01/21 1709 (!) 152/65     Pulse Rate 03/01/21 1709 70     Resp 03/01/21 1709 18     Temp 03/01/21 1709 99.5 F (37.5 C)     Temp Source 03/01/21 1709 Oral     SpO2 03/01/21 1709 95 %     Weight 03/01/21 1710 56.7 kg (125 lb)     Height 03/01/21 1710 1.575 m ( )     Head Circumference --      Peak Flow --      Pain Score 03/01/21 1718 6     Pain Loc --      Pain Edu? --      Excl. in GC? --     Constitutional: Alert and oriented.  Eyes: Conjunctivae are normal.  Head:  Atraumatic. Nose: No congestion/rhinnorhea. Mouth/Throat: Patient is wearing a mask. Neck: No stridor.  No meningeal signs.   Cardiovascular: Normal rate, regular rhythm. Good peripheral circulation. Respiratory: Normal respiratory effort.  No retractions. Gastrointestinal: Soft and nondistended.  No detectable bruit.  Tenderness to palpation throughout the lower abdomen, more noticeable left of midline in the lower abdomen. Musculoskeletal: No lower extremity tenderness nor edema. No gross deformities of extremities. Neurologic:  Normal speech and language. No gross focal neurologic deficits are appreciated.  Skin:  Skin is warm, dry and intact. Psychiatric: Mood and affect are normal. Speech and behavior are normal.  ____________________________________________   LABS (all labs ordered are listed, but only abnormal results are displayed)  Labs Reviewed  COMPREHENSIVE METABOLIC PANEL -  Abnormal; Notable for the following components:      Result Value   Potassium 3.4 (*)    Chloride 97 (*)    Glucose, Bld 110 (*)    All other components within normal limits  CBC - Abnormal; Notable for the following components:   Hemoglobin 10.5 (*)    HCT 32.5 (*)    MCV 76.1 (*)    MCH 24.6 (*)    RDW 17.2 (*)    All other components within normal limits  URINALYSIS, COMPLETE (UACMP) WITH MICROSCOPIC - Abnormal; Notable for the following components:   Color, Urine YELLOW (*)    APPearance CLEAR (*)    Specific Gravity, Urine >1.046 (*)    Protein, ur 100 (*)    All other components within normal limits  TROPONIN I (HIGH SENSITIVITY) - Abnormal; Notable for the following components:   Troponin I (High Sensitivity) 19 (*)    All other components within normal limits  TROPONIN I (HIGH SENSITIVITY) - Abnormal; Notable for the following components:   Troponin I (High Sensitivity) 19 (*)    All other components within normal limits  RESP PANEL BY RT-PCR (FLU A&B, COVID) ARPGX2  LIPASE, BLOOD   PROTIME-INR  APTT   ____________________________________________  EKG  ED ECG REPORT I, Loleta Rose, the attending physician, personally viewed and interpreted this ECG.  Date: 03/01/2021 EKG Time: 17:16 Rate: 71 Rhythm: normal sinus rhythm QRS Axis: RAD Intervals: Biventricular hypertrophy ST/T Wave abnormalities: Non-specific ST segment / T-wave changes, but no clear evidence of acute ischemia. Narrative Interpretation: no definitive evidence of acute ischemia; does not meet STEMI criteria.  ____________________________________________  RADIOLOGY I, Loleta Rose, personally viewed and evaluated these images (plain radiographs) as part of my medical decision making, as well as reviewing the written report by the radiologist.  ED MD interpretation: There appears to be an interval increase of the size of the aortic aneurysm at the graft location between the thoracic and abdominal aorta now measuring nearly 7 cm in diameter and a type III endoleak with IV contrast noted to extend into the lumen.  Official radiology report(s): CT Angio Chest/Abd/Pel for Dissection W and/or Wo Contrast  Result Date: 03/01/2021 CLINICAL DATA:  Abdominal pain, aortic dissection suspected. Pt to ER via POV with complaints of intermittent/ sharp generalized abdominal pain that started on Monday. Reports thinking it was gas and taking gas-ex with no relief in symptom. H/o type B dissection. Per ED provider at CT that was done approximately 1 month ago at [facility] demonstrated a type 3 endoleak at the joining/junction of the thoracic and abdominal graft repairs with intravenous contrast noted within the excluded lumen of the aorta. EXAM: CT ANGIOGRAPHY CHEST, ABDOMEN AND PELVIS TECHNIQUE: Non-contrast CT of the chest was initially obtained. Multidetector CT imaging through the chest, abdomen and pelvis was performed using the standard protocol during bolus administration of intravenous contrast. Multiplanar  reconstructed images and MIPs were obtained and reviewed to evaluate the vascular anatomy. CONTRAST:  OMNIPAQUE IOHEXOL 350 MG/ML SOLN COMPARISON:  CT angiography chest 08/30/2019 FINDINGS: CT CHEST, ABDOMEN, PELVIS FINDINGS CTA ABDOMEN AND PELVIS FINDINGS VASCULAR Aorta: Preferential opacification of the aorta. The ascending aorta is normal in caliber with no evidence of dissection. Query aortic valve leaflet calcification (8:66). Similar-appearing status post descending thoracic aorta endograft repair of a type B dissection. Interval placement of an endograft repair extension inferiorly to include the suprarenal and the majority of the infrarenal abdominal aorta. Perforating grafts of the origin/proximal celiac,  bilateral renal, and superior mesenteric arteries are noted. An endoleak is noted at the level of the thoracic aorta and abdominal aortic graft superposition at the level of the aortic hiatus with interval increase in size of the aortic diameter measuring 6.8 x 5.7 cm (from non-surgerized level of 6.5 x 5.1 cm on CT chest 08/30/2019). Intravenous contrast is noted to extend into the excluded lumen of the abdominal aorta at this level (8:81). Normal caliber of the surgerized and non-surgerized abdominal aorta. Grossly stable aortic caliber of the thoracic aorta measuring 4.4 x 4.2 cm (8:63). No dissection noted of the non-surgerized infrarenal abdominal aorta. No aneurysmal disease dilatation or dissection of the main branches arising off of the aortic arch. Atherosclerotic plaque with no severe stenosis. No periaortic fat stranding. Pulmonary artery: The main pulmonary artery is enlarged in caliber measuring up to 3.8 cm. No central or proximal segmental pulmonary embolus identified. Coronary arteries: At least 3 vessel coronary calcifications. Celiac: Patent without evidence of aneurysm, dissection, vasculitis or significant stenosis. SMA: Patent without evidence of aneurysm, dissection, vasculitis  or significant stenosis. Renals: Both renal arteries are patent without evidence of aneurysm, dissection, vasculitis, fibromuscular dysplasia or significant stenosis. IMA: Not visualized possibly thrombosed. Inflow: Moderate to severe calcified and noncalcified atherosclerotic plaque. 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 Normal caliber of the left common iliac artery. Normal caliber of the external and internal iliac arteries. Otherwise patent without evidence of dissection, vasculitis or significant stenosis. Veins: No mesenteric or portal venous gas. No obvious venous abnormality within the limitations of this arterial phase study. Review of the MIP images confirms the above findings. NON-VASCULAR Cardiovascular: Similar-appearing cardiomegaly. No pericardial effusion. Mediastinum/Nodes: No enlarged mediastinal, hilar, or axillary lymph nodes. Thyroid gland, trachea, and esophagus demonstrate no significant findings. Lungs/Pleura: Centrilobular and paraseptal emphysematous changes. No focal consolidation. No pulmonary nodule. No pulmonary mass. Trace loculated fluid along the left major fissure. Otherwise no pleural effusion. No pneumothorax. Musculoskeletal: No chest wall abnormality. No suspicious lytic or blastic osseous lesions. No acute displaced fracture. Review of the MIP images confirms the above findings. Hepatobiliary: No focal liver abnormality. Several subcentimeter calcified gallstones noted within the gallbladder lumen. No gallbladder wall thickening or pericholecystic fluid. No biliary dilatation. Pancreas: No focal lesion. Normal pancreatic contour. No surrounding inflammatory changes. No main pancreatic ductal dilatation. Spleen: Normal in size without focal abnormality. Adrenals/Urinary Tract: No adrenal nodule bilaterally. Bilateral kidneys enhance symmetrically. Subcentimeter hypodensities too small to characterize. No hydronephrosis. No hydroureter.  The urinary bladder is unremarkable. Stomach/Bowel: Small and large bowel resections noted. Stomach is within normal limits. No evidence of bowel wall thickening or dilatation. No pneumatosis. Appendix appears normal. Lymphatic: No lymphadenopathy. Reproductive: Heterogeneous uterine wall with calcifications likely represent fibroids. Otherwise uterus and bilateral adnexa are unremarkable. Other: No intraperitoneal free fluid. No intraperitoneal free gas. No organized fluid collection. Musculoskeletal: No chest wall abnormality.  No abdominal wall hernia or abnormality. No suspicious lytic or blastic osseous lesions. No acute displaced fracture. Review of the MIP images confirms the above findings. IMPRESSION: 1. Similar-appearing descending thoracic aorta endograft repair of a type B dissection with status post interval placement of an endograft repair extension inferiorly to include the suprarenal and the majority of the infrarenal abdominal aorta. 2. A type 3 endoleak at the level of the thoracic and abdominal aortic graft superposition is noted with interval increase in size of the aortic diameter measuring 6.8 x 5.7 cm (from non-surgerized level of 6.5  x 5.1 cm on CT chest 08/30/2019). Correlation with postop cross-sectional imaging would be of value (not available in our PACS but per ED provider imaged at [facility] about 1 month ago.) 3. Inferior mesenteric artery is not visualized and possibly thrombosed. 4. 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 Recommend comparison of caliber to most recent [facility] CT. 5. No central or proximal segmental pulmonary embolus. 6. Status post small and large bowel resections noted no findings of bowel obstruction, bowel ischemia, or inflammatory changes. 7. Cholelithiasis with no CT findings of acute cholecystitis. 8. Uterine fibroids. 9. Aortic Atherosclerosis (ICD10-I70.0) including coronary calcifications and Emphysema (ICD10-J43.9). These results were called by telephone  at the time of interpretation on 03/01/2021 at 7:22 pm to provider Dr. Sidney Ace, who verbally acknowledged these results. Electronically Signed   By: Tish Frederickson M.D.   On: 03/01/2021 19:40    ____________________________________________   PROCEDURES   Procedure(s) performed (including Critical Care):  .1-3 Lead EKG Interpretation  Date/Time: 03/02/2021 12:02 AM Performed by: Loleta Rose, MD Authorized by: Loleta Rose, MD     Interpretation: normal     ECG rate:  67   ECG rate assessment: normal     Rhythm: sinus rhythm     Ectopy: none     Conduction: normal   .Critical Care  Date/Time: 03/02/2021 1:50 AM Performed by: Loleta Rose, MD Authorized by: Loleta Rose, MD   Critical care provider statement:    Critical care time (minutes):  45   Critical care time was exclusive of:  Separately billable procedures and treating other patients   Critical care was necessary to treat or prevent imminent or life-threatening deterioration of the following conditions:  Circulatory failure (TAA/AAA w/ need for emergent BP control)   Critical care was time spent personally by me on the following activities:  Development of treatment plan with patient or surrogate, discussions with consultants, evaluation of patient's response to treatment, examination of patient, obtaining history from patient or surrogate, ordering and performing treatments and interventions, ordering and review of laboratory studies, ordering and review of radiographic studies, pulse oximetry, re-evaluation of patient's condition and review of old charts   ____________________________________________   INITIAL IMPRESSION / MDM / ASSESSMENT AND PLAN / ED COURSE  As part of my medical decision making, I reviewed the following data within the electronic MEDICAL RECORD NUMBER Nursing notes reviewed and incorporated, Labs reviewed , EKG interpreted , Old chart reviewed, Discussed with admitting physician , and Notes from prior ED  visits   Differential diagnosis includes, but is not limited to, worsening abdominal aortic aneurysm with or without rupture or leak, SBO/ileus, UTI, diverticulitis, appendicitis.  The patient is on the cardiac monitor to evaluate for evidence of arrhythmia and/or significant heart rate changes.  Concern for changes seen on the CT scan compared to prior.  Patient is a couple of months status post vascular surgery at [facility].  I explained to the patient the need to speak with vascular at [facility] and I asked my secretary to place a call to [facility] so that I can speak with vascular and asked them to compare the images and asked recommendations, should the patient be transferred emergently, etc.  We have however shared the imaging with [facility] so that I could see the results.  The patient requires transfer to [facility] for the following reasons: Continuity of care, needs a higher level of care (patient is in a vascular surgery study due to her endograft at [facility]), and patient/family  request.  Vital signs are generally appropriate; based on prior notes at [facility], the patient remains hypertensive at baseline.  Stable troponin x2.  Comprehensive metabolic panel, CBC, and coagulation studies are all within normal limits.     Clinical Course as of 03/02/21 0357  [facility]  0025 Updated family.  Awaiting callback from [facility].  Of note, patient's daughter reported to me that the patient is part of a special trial at [facility] and that they wanted to know about anything that happened.  I assured her I would discuss this with the vascular provider when I have the opportunity. [CF]  [id number] I spoke by phone with the transfer center and explained the situation.  She is in the process of paging out vascular surgery to call me back. [CF]  0113 I spoke by phone with Dr. Jenita Seashore with [facility] vascular surgery.  We discussed the case in detail.  He is going to review the images and call me back. [CF]  [phone] Discussed case by phone again with  Dr. Fenton Malling.  He recommended blood pressure and rate control.  The target heart rate is around 70, 80 at the maximum.  However right now she is in the 60s to 70s so he does not recommend any specific rate control although if her heart rate increases beyond 80 he recommends an esmolol drip.  He recommends starting a nicardipine infusion with a target systolic blood pressure of less than 120.  Currently she is at 143/67.  The goal is to see if she becomes pain-free with better blood pressure control.  He excepted the patient to an ICU bed at [facility] and the transfer center told me that they do have ICU beds available.  She said that her abdominal pain is a little bit better since her blood pressure has come down but it is still present.  I updated her with the plan for transfer and she agrees with the plan. [CF]  0240 SBP 138, HR remains at about 75.  Titrating up Cardene to 5.5 mg/hr per rapid titration protocol.  Holding off on rate control as patient remains below 80.  Nurse Laurice Record) is calling report to [facility]. [CF]  0335 We have explored every possible ALS transport service to see if anyone is available to take the patient by ground transport to [facility].  Unfortunately no appropriate ambulances are available, and I am told that the wait time for an ambulance may be another 5 hours.  Unfortunately given the nature of the patient's medical complaint this is not acceptable and she needs to be seen at the ICU at [facility] by vascular surgery as soon as possible.  I have authorized helicopter transportation since no grand transport is available and we are reaching out to [facility] to see if they have transport available by helicopter. [CF]  0356 [facility] is on the way to pick up the patient.  Her blood pressure has improved; systolic blood pressure is about 125 at this time.  Deferring additional medication changes as Alcario Drought is about to arrive. [CF]    Clinical Course User Index [CF] Loleta Rose, MD      ____________________________________________  FINAL CLINICAL IMPRESSION(S) / ED DIAGNOSES  Final diagnoses:  Thoracic aortic aneurysm without rupture (HCC)  Abdominal aortic aneurysm (AAA) greater than 5.0 cm in diameter in female [facility])     MEDICATIONS GIVEN DURING THIS VISIT:  Medications  nicardipine (CARDENE)  in 0.86% saline IV infusion (0.1 mg/ml) (15  mg/hr Intravenous Rate/Dose Change 03/02/21 0316)  oxyCODONE-acetaminophen (PERCOCET/ROXICET) 5-325 MG per tablet 1 tablet (1 tablet Oral Given 03/01/21 1736)  iohexol (OMNIPAQUE) 350 MG/ML injection 100 mL (100 mLs Intravenous Contrast Given 03/01/21 1824)     ED Discharge Orders     None        Note:  This document was prepared using Dragon voice recognition software and may include unintentional dictation errors.   Loleta RoseForbach, Necha Harries, MD 03/02/21 [phone]

## 2021-03-01 NOTE — ED Triage Notes (Addendum)
Pt to ER via POV with complaints of intermittent/ sharp generalized abdominal pain that started on Monday. Reports thinking it was gas and taking gas-ex with no relief in symptoms. States pain is worst at area of abdominal incision, had a triple A repair/ stent placed a couple of months ago. Denies fall or injury.  Last bowel movement today. No blood in stools. No urinary symptoms, no NVD.

## 2021-03-02 LAB — URINALYSIS, COMPLETE (UACMP) WITH MICROSCOPIC
Bacteria, UA: NONE SEEN
Bilirubin Urine: NEGATIVE
Glucose, UA: NEGATIVE mg/dL
Hgb urine dipstick: NEGATIVE
Ketones, ur: NEGATIVE mg/dL
Leukocytes,Ua: NEGATIVE
Nitrite: NEGATIVE
Protein, ur: 100 mg/dL — AB
Specific Gravity, Urine: >1.046 — ABNORMAL HIGH (ref 1.005–1.030)
pH: 6 (ref 5.0–8.0)

## 2021-03-02 LAB — TROPONIN I (HIGH SENSITIVITY): Troponin I (High Sensitivity): 19 ng/L — ABNORMAL HIGH (ref <18)

## 2021-03-02 LAB — RESP PANEL BY RT-PCR (FLU A&B, COVID) ARPGX2
Influenza A by PCR: NEGATIVE
Influenza B by PCR: NEGATIVE
SARS Coronavirus 2 by RT PCR: NEGATIVE

## 2021-03-02 MED ORDER — NICARDIPINE HCL IN NACL 20-0.86 MG/200ML-% IV SOLN
3.0000 mg/h | INTRAVENOUS | Status: DC
  Administered 2021-03-02: 5 mg/h via INTRAVENOUS
  Administered 2021-03-02: 15 mg/h via INTRAVENOUS
  Filled 2021-03-02 (×2): qty 200

## 2021-03-02 NOTE — ED Notes (Signed)
Called [facility] transfer (Amy) about vascular...Marland Kitchen per Amy they are working on it

## 2021-03-02 NOTE — ED Notes (Signed)
EMTALA reviewed by this RN.  Transfer consent signed. ?

## 2021-04-16 ENCOUNTER — Other Ambulatory Visit: Payer: Self-pay

## 2021-04-16 ENCOUNTER — Emergency Department: Payer: Medicaid Other

## 2021-04-16 ENCOUNTER — Inpatient Hospital Stay
Admission: EM | Admit: 2021-04-16 | Discharge: 2021-04-18 | DRG: 392 | Discharge status: Against Medical Advice | Payer: Medicaid Other | Attending: Internal Medicine | Admitting: Internal Medicine

## 2021-04-16 DIAGNOSIS — K449 Diaphragmatic hernia without obstruction or gangrene: Secondary | ICD-10-CM | POA: Diagnosis present

## 2021-04-16 DIAGNOSIS — Z720 Tobacco use: Secondary | ICD-10-CM | POA: Diagnosis present

## 2021-04-16 DIAGNOSIS — Z79899 Other long term (current) drug therapy: Secondary | ICD-10-CM

## 2021-04-16 DIAGNOSIS — J449 Chronic obstructive pulmonary disease, unspecified: Secondary | ICD-10-CM | POA: Diagnosis present

## 2021-04-16 DIAGNOSIS — K219 Gastro-esophageal reflux disease without esophagitis: Secondary | ICD-10-CM | POA: Diagnosis present

## 2021-04-16 DIAGNOSIS — F17210 Nicotine dependence, cigarettes, uncomplicated: Secondary | ICD-10-CM | POA: Diagnosis present

## 2021-04-16 DIAGNOSIS — Z7982 Long term (current) use of aspirin: Secondary | ICD-10-CM

## 2021-04-16 DIAGNOSIS — Z20822 Contact with and (suspected) exposure to covid-19: Secondary | ICD-10-CM | POA: Diagnosis present

## 2021-04-16 DIAGNOSIS — D62 Acute posthemorrhagic anemia: Secondary | ICD-10-CM | POA: Diagnosis not present

## 2021-04-16 DIAGNOSIS — T82310A Breakdown (mechanical) of aortic (bifurcation) graft (replacement), initial encounter: Secondary | ICD-10-CM | POA: Diagnosis present

## 2021-04-16 DIAGNOSIS — E785 Hyperlipidemia, unspecified: Secondary | ICD-10-CM | POA: Diagnosis not present

## 2021-04-16 DIAGNOSIS — J439 Emphysema, unspecified: Secondary | ICD-10-CM

## 2021-04-16 DIAGNOSIS — Y712 Prosthetic and other implants, materials and accessory cardiovascular devices associated with adverse incidents: Secondary | ICD-10-CM | POA: Diagnosis present

## 2021-04-16 DIAGNOSIS — K31819 Angiodysplasia of stomach and duodenum without bleeding: Principal | ICD-10-CM | POA: Diagnosis present

## 2021-04-16 DIAGNOSIS — K922 Gastrointestinal hemorrhage, unspecified: Secondary | ICD-10-CM | POA: Diagnosis not present

## 2021-04-16 DIAGNOSIS — R944 Abnormal results of kidney function studies: Secondary | ICD-10-CM | POA: Diagnosis present

## 2021-04-16 DIAGNOSIS — Z8679 Personal history of other diseases of the circulatory system: Secondary | ICD-10-CM

## 2021-04-16 DIAGNOSIS — Z7951 Long term (current) use of inhaled steroids: Secondary | ICD-10-CM

## 2021-04-16 DIAGNOSIS — I10 Essential (primary) hypertension: Secondary | ICD-10-CM | POA: Diagnosis present

## 2021-04-16 DIAGNOSIS — K921 Melena: Secondary | ICD-10-CM

## 2021-04-16 DIAGNOSIS — K5520 Angiodysplasia of colon without hemorrhage: Secondary | ICD-10-CM

## 2021-04-16 HISTORY — DX: Acute posthemorrhagic anemia: D62

## 2021-04-16 LAB — CBC
HCT: 16.5 % — ABNORMAL LOW (ref 36.0–46.0)
Hemoglobin: 5.1 g/dL — ABNORMAL LOW (ref 12.0–15.0)
MCH: 24.2 pg — ABNORMAL LOW (ref 26.0–34.0)
MCHC: 30.9 g/dL (ref 30.0–36.0)
MCV: 78.2 fL — ABNORMAL LOW (ref 80.0–100.0)
Platelets: 272 10*3/uL (ref 150–400)
RBC: 2.11 MIL/uL — ABNORMAL LOW (ref 3.87–5.11)
RDW: 20.3 % — ABNORMAL HIGH (ref 11.5–15.5)
WBC: 6.1 10*3/uL (ref 4.0–10.5)
nRBC: 0 % (ref 0.0–0.2)

## 2021-04-16 LAB — COMPREHENSIVE METABOLIC PANEL
ALT: 9 U/L (ref 0–44)
AST: 17 U/L (ref 15–41)
Albumin: 3 g/dL — ABNORMAL LOW (ref 3.5–5.0)
Alkaline Phosphatase: 67 U/L (ref 38–126)
Anion gap: 7 (ref 5–15)
BUN: 39 mg/dL — ABNORMAL HIGH (ref 8–23)
CO2: 25 mmol/L (ref 22–32)
Calcium: 8.8 mg/dL — ABNORMAL LOW (ref 8.9–10.3)
Chloride: 107 mmol/L (ref 98–111)
Creatinine, Ser: 1 mg/dL (ref 0.44–1.00)
GFR, Estimated: >60 mL/min (ref >60)
Glucose, Bld: 123 mg/dL — ABNORMAL HIGH (ref 70–99)
Potassium: 4 mmol/L (ref 3.5–5.1)
Sodium: 139 mmol/L (ref 135–145)
Total Bilirubin: 0.4 mg/dL (ref 0.3–1.2)
Total Protein: 6.4 g/dL — ABNORMAL LOW (ref 6.5–8.1)

## 2021-04-16 LAB — RESP PANEL BY RT-PCR (FLU A&B, COVID) ARPGX2
Influenza A by PCR: NEGATIVE
Influenza B by PCR: NEGATIVE
SARS Coronavirus 2 by RT PCR: NEGATIVE

## 2021-04-16 LAB — PREPARE RBC (CROSSMATCH)

## 2021-04-16 LAB — RETICULOCYTES
Immature Retic Fract: 40.6 % — ABNORMAL HIGH (ref 2.3–15.9)
RBC.: 2.13 MIL/uL — ABNORMAL LOW (ref 3.87–5.11)
Retic Count, Absolute: 104.6 10*3/uL (ref 19.0–186.0)
Retic Ct Pct: 4.9 % — ABNORMAL HIGH (ref 0.4–3.1)

## 2021-04-16 MED ORDER — ALBUTEROL SULFATE HFA 108 (90 BASE) MCG/ACT IN AERS
2.0000 | INHALATION_SPRAY | Freq: Four times a day (QID) | RESPIRATORY_TRACT | Status: DC | PRN
  Filled 2021-04-16: qty 6.7

## 2021-04-16 MED ORDER — SODIUM CHLORIDE 0.9 % IV SOLN
10.0000 mL/h | Freq: Once | INTRAVENOUS | Status: AC
  Administered 2021-04-16: 10 mL/h via INTRAVENOUS

## 2021-04-16 MED ORDER — PANTOPRAZOLE INFUSION (NEW) - SIMPLE MED
8.0000 mg/h | INTRAVENOUS | Status: DC
  Administered 2021-04-17: 8 mg/h via INTRAVENOUS
  Filled 2021-04-16: qty 80

## 2021-04-16 MED ORDER — ACETAMINOPHEN 650 MG RE SUPP: 650.0000 mg | Freq: Four times a day (QID) | RECTAL | Status: DC | PRN

## 2021-04-16 MED ORDER — PANTOPRAZOLE 80MG IVPB - SIMPLE MED
80.0000 mg | Freq: Once | INTRAVENOUS | Status: AC
  Administered 2021-04-16: 80 mg via INTRAVENOUS
  Filled 2021-04-16: qty 80

## 2021-04-16 MED ORDER — IOHEXOL 350 MG/ML SOLN
75.0000 mL | Freq: Once | INTRAVENOUS | Status: AC | PRN
  Administered 2021-04-16: 75 mL via INTRAVENOUS

## 2021-04-16 MED ORDER — ACETAMINOPHEN 325 MG PO TABS
650.0000 mg | ORAL_TABLET | Freq: Four times a day (QID) | ORAL | Status: DC | PRN
  Administered 2021-04-18 (×2): 650 mg via ORAL
  Filled 2021-04-16 (×2): qty 2

## 2021-04-16 MED ORDER — MOMETASONE FURO-FORMOTEROL FUM 100-5 MCG/ACT IN AERO
2.0000 | INHALATION_SPRAY | Freq: Two times a day (BID) | RESPIRATORY_TRACT | Status: DC
  Administered 2021-04-17 – 2021-04-18 (×2): 2 via RESPIRATORY_TRACT
  Filled 2021-04-16 (×2): qty 8.8

## 2021-04-16 NOTE — ED Notes (Signed)
IV team at bedside 

## 2021-04-16 NOTE — ED Triage Notes (Signed)
Pt states that she started having black stools 2 days ago- pt states she does take blood thinners- pt states she also feel like her heart is "beating in her head"- pt also states she has been weak since yesterday and unable to walk d/t pain in her back and legs

## 2021-04-16 NOTE — ED Notes (Signed)
Reports received for this Pt ,  planned for transfer to other facility for aneurysm leak, 1unit PRBC given , 2nd unit due. Pt seen in room AAOx4, in NAD . Noted IV to Rt upper arm non-patent at this time, CN made aware : attempted to do IV access at this time

## 2021-04-16 NOTE — H&P (Signed)
History and Physical    PLEASE NOTE THAT DRAGON DI[employer] SOFTWARE WAS USED IN THE CONSTRUCTION OF THIS NOTE.   Jamie Roman HDQ:[id number] DOB: 11/28/1960 DOA: 04/16/2021  PCP: [facility] [employer] Patient coming from: home   I have personally briefly reviewed patient's old medical records in [facility]  Chief Complaint: Melena  HPI: Jamie Roman is a 60 y.o. 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   The patient reports 3-4 days of dark colored stool, at a rate of 1 episode of melena per day over that time, in the absence of any associated hematochezia, with most recent melena occurring on the AM of 04/16/21.  Not associated with abdominal pain, N/V, hematemesis, loose stool, and denies any preceding trauma. Also denies any associated SOB, CP, palpitations, diaphoresis, dizziness, presyncope, or syncope. Denies any recent travel or any associated subjective fever, chills, rigors, generalized myalgias, rash. Not associated with dysuria, gross hematuria, or change urinary urgency/frequency. Denies any associated cough or hemoptysis.   No prior history of suspected gastrointestinal bleed, and the patient does not believe that she has previously undergone EGD. The patient denies any regular or recent consumption of alcohol, and denies any known history of underlying chronic liver disease.  She confirms that she is on a daily baby aspirin as an outpatient, with most recent dose occurring on 04/16/2021, but otherwise denies use of any additional blood thinners at home.  No regular or recent use of NSAIDs, and also denies use of BC's or Goodies powder. Also denies taking any of the following oral medications at home:  systemic corticosteroids, supplemental potassium, supplemental iron, or bisphosphonate medications. Acknowledges history of GERD and reports good compliance with outpatient daily protonix.   Denies any personal history of malignancy, and denies any recent unintentional weight loss, dysphagia, or early satiety.  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 Karie Mainland of vascular surgery.  She also has a history of essential hypertension for which her outpatient antihypertensive regimen includes Norvasc, Coreg, losartan.   Per chart review, it appears that patient's baseline hemoglobin is 10-12,, with most recent prior hemoglobin noted to be 9.9 on 03/28/2021 and 10.5 on 03/01/2021. No known history of prior blood transfusion.     ED Course:  Vital signs in the ED were notable for the following: Temperature max 98.5; heart rate 64-77; blood pressure 127/56 -152/65; respiratory rate 15-21, oxygen saturation 94 to 97% on room air.  Labs were notable for the following: CMP notable for the following: Sodium 139, bicarbonate 25, BUN 39 compared to most recent prior value of 20 on 03/01/2021, creatinine 1.0 relative to 0.99 on 03/01/2021, and liver enzymes were found to be within normal limits.  CBC notable for white blood cell count 6100, hemoglobin 5.1 associated with MCV 78, RDW 20.3, and normochromic finding, platelet count 272.  INR ordered, with result currently pending.  Screening COVID-19 PCR was checked in the ED today, with result currently pending.  Imaging and additional notable ED work-up: EKG shows sinus rhythm with heart rate 61, normal intervals, and no evidence of T wave or ST changes, including no evidence of ST elevation.  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   EDP reviewed patient's case and imaging with discussed with vascular surgery at [facility], who were able to bring up and compare today's CTA relative to that performed on 03/01/2021, and noted interval improvement in endoleak of distal descending thoracic aorta relative to CTA on 03/01/21. [facility] vascular does not suspect any contribution towards presenting acute upper GI bleed from this recent history of AAA repair, does not recommend further vascular surgery evaluation, but rather recs standing acute upper GI work-up.  Consequently, vascular surgery at [facility] conveys that there is no indication for transfer at this time.   While in the ED, the following were administered: Protonix 80 mg IV x1 followed by initiation of Protonix drip.  Type and screen performed, and transfusion of 2 units PRBC initiated.      Review of Systems: As per HPI otherwise 10 point review of systems negative.   Past Medical History:  Diagnosis Date   AAA (abdominal aortic aneurysm) (HCC)    Bowel obstruction (HCC)    COPD (chronic obstructive pulmonary disease) (HCC)    Headache(784.0)    Hypertension     Past Surgical History:  Procedure Laterality Date   ABDOMINAL AORTIC ANEURYSM REPAIR     COLOSTOMY     COLOSTOMY REVERSAL     ORIF ANKLE FRACTURE Right 09/01/2019   Procedure: OPEN REDUCTION INTERNAL FIXATION (ORIF) ANKLE FRACTURE;  Surgeon: Juanell Fairly, MD;  Location: [facility] ORS;  Service: Orthopedics;  Laterality: Right;    Social History:  reports that she has been smoking cigarettes. She has a 7.50 pack-year smoking history. She has never used smokeless tobacco. She reports that she does not currently use alcohol. No history on file for drug use.   No Known Allergies   Family history reviewed and not pertinent    Prior to Admission medications   Medication Sig Start Date End Date Taking? Authorizing Provider  acetaminophen (TYLENOL) 325 MG tablet Take 2 tablets (650 mg total) by  mouth every 6 (six) hours as needed for mild pain (or Fever >/= 101). 09/04/19   Lynn Ito, MD  acidophilus (RISAQUAD) CAPS capsule Take 2 capsules by mouth 2 (two) times daily. 09/04/19   Lynn Ito, MD  albuterol (VENTOLIN HFA) 108 (90 Base) MCG/ACT inhaler Inhale 2 puffs into the lungs every 6 (six) hours as needed for wheezing or shortness of breath. 09/04/19   Lynn Ito, MD  amLODipine (NORVASC) 10 MG tablet Take 1 tablet (10 mg total) by mouth daily. 01/05/12 08/30/19  Viyuoh, Rolland Bimler, MD  ASPIRIN LOW DOSE 81 MG EC tablet Take 81 mg by mouth daily. 08/10/19   [provider]  atorvastatin (LIPITOR) 40 MG tablet Take 40 mg by mouth at bedtime. 08/10/19   [provider]  carvedilol (COREG) 25 MG tablet Take 25 mg by mouth 2 (two) times daily. 07/29/19   [provider]  doxycycline (VIBRA-TABS) 100 MG tablet Take 1 tablet (100 mg total) by mouth every 12 (twelve) hours. 09/04/19   Lynn Ito, MD  enoxaparin (LOVENOX) 40 MG/0.4ML injection Inject 0.4 mLs (40 mg total) into the skin daily for 14 days. 09/04/19 09/18/19  Lynn Ito, MD  guaiFENesin (MUCINEX) 600 MG 12 hr tablet Take 1 tablet (600 mg total) by mouth 2 (two) times daily. 09/04/19   Lynn Ito, MD  hydrochlorothiazide (MICROZIDE) 12.5 MG capsule Take 12.5  mg by mouth daily. 08/06/19   [provider]  losartan (COZAAR) 100 MG tablet Take 100 mg by mouth daily. 06/13/19   [provider]  pantoprazole (PROTONIX) 20 MG tablet Take 1 tablet (20 mg total) by mouth daily. 09/05/19   Lynn Ito, MD  SYMBICORT 80-4.5 MCG/ACT inhaler Inhale 2 puffs into the lungs 2 (two) times daily. 08/21/19   [provider]     Objective    Physical Exam: Vitals:   04/16/21 1552 04/16/21 1605 04/16/21 1700 04/16/21 1800  BP: (!) 130/56 (!) 129/57 (!) 129/57 (!) 152/65  Pulse: 76 72 71 73  Resp: (!) 21 (!) 22 (!) 21 18  Temp:  97.6 F (36.4 C)    TempSrc:  Oral    SpO2: 94% 92% 91% 97%  Weight:       Height:        General: appears to be stated age; alert, oriented Skin: warm, dry, no rash Head:  AT/Capac Mouth:  Oral mucosa membranes appear moist, normal dentition Neck: supple; trachea midline Heart:  RRR; did not appreciate any M/R/G Lungs: CTAB, did not appreciate any wheezes, rales, or rhonchi Abdomen: + BS; soft, ND, NT Vascular: 2+ pedal pulses b/l; 2+ radial pulses b/l Extremities: no peripheral edema, no muscle wasting Neuro: strength and sensation intact in upper and lower extremities b/l    Labs on Admission: I have personally reviewed following labs and imaging studies  CBC: Recent Labs  Lab 04/16/21 1134  WBC 6.1  HGB 5.1*  HCT 16.5*  MCV 78.2*  PLT 272   Basic Metabolic Panel: Recent Labs  Lab 04/16/21 1134  NA 139  K 4.0  CL 107  CO2 25  GLUCOSE 123*  BUN 39*  CREATININE 1.00  CALCIUM 8.8*   GFR: Estimated Creatinine Clearance: 46.1 mL/min (by C-G formula based on SCr of 1 mg/dL). Liver Function Tests: Recent Labs  Lab 04/16/21 1134  AST 17  ALT 9  ALKPHOS 67  BILITOT 0.4  PROT 6.4*  ALBUMIN 3.0*   No results for input(s): LIPASE, AMYLASE in the last 168 hours. No results for input(s): AMMONIA in the last 168 hours. Coagulation Profile: No results for input(s): INR, PROTIME in the last 168 hours. Cardiac Enzymes: No results for input(s): CKTOTAL, CKMB, CKMBINDEX, TROPONINI in the last 168 hours. BNP (last 3 results) No results for input(s): PROBNP in the last 8760 hours. HbA1C: No results for input(s): HGBA1C in the last 72 hours. CBG: No results for input(s): GLUCAP in the last 168 hours. Lipid Profile: No results for input(s): CHOL, HDL, LDLCALC, TRIG, CHOLHDL, LDLDIRECT in the last 72 hours. Thyroid Function Tests: No results for input(s): TSH, T4TOTAL, FREET4, T3FREE, THYROIDAB in the last 72 hours. Anemia Panel: No results for input(s): VITAMINB12, FOLATE, FERRITIN, TIBC, IRON, RETICCTPCT in the last 72 hours. Urine  analysis:    Component Value Date/Time   COLORURINE YELLOW (A) 03/02/2021 0005   APPEARANCEUR CLEAR (A) 03/02/2021 0005   APPEARANCEUR Cloudy 07/25/2012 1408   LABSPEC >1.046 (H) 03/02/2021 0005   LABSPEC 1.043 07/25/2012 1408   PHURINE 6.0 03/02/2021 0005   GLUCOSEU NEGATIVE 03/02/2021 0005   GLUCOSEU 150 mg/dL 78/29/5621 3086   HGBUR NEGATIVE 03/02/2021 0005   BILIRUBINUR NEGATIVE 03/02/2021 0005   BILIRUBINUR Negative 07/25/2012 1408   KETONESUR NEGATIVE 03/02/2021 0005   PROTEINUR 100 (A) 03/02/2021 0005   UROBILINOGEN 0.2 01/05/2012 0135   NITRITE NEGATIVE 03/02/2021 0005   LEUKOCYTESUR NEGATIVE 03/02/2021 0005  LEUKOCYTESUR Negative 07/25/2012 1408    Radiological Exams on Admission: CT ANGIO GI BLEED  Result Date: 04/16/2021 CLINICAL DATA:  Black stools for 2 days, blood thinners, weakness, history of aortic dissection with stent endograft repair EXAM: CTA ABDOMEN AND PELVIS WITHOUT AND WITH CONTRAST TECHNIQUE: Multidetector CT imaging of the abdomen and pelvis was performed using the standard protocol during bolus administration of intravenous contrast. Multiplanar reconstructed images and MIPs were obtained and reviewed to evaluate the vascular anatomy. CONTRAST:  15mL OMNIPAQUE IOHEXOL 350 MG/ML SOLN COMPARISON:  CT chest abdomen pelvis angiogram, 03/01/2021 FINDINGS: VASCULAR Redemonstrated, partially imaged descending stent endograft repair of the thoracic and upper abdominal aorta, with stenting of the aortic branch vessel origins. 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 The abdominal aorta is nonaneurysmal. 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 Excluded aneurysm sac diameter is not significantly changed compared to recent prior examination, measuring approximately 6.3 x 5.8 cm. 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 Review of the  MIP images confirms the above findings. NON-VASCULAR Lower chest: Cardiomegaly Hepatobiliary: No solid liver abnormality is seen. Small gallstones in the gallbladder fundus. Gallbladder wall thickening, or biliary dilatation. Pancreas: Unremarkable. No pancreatic ductal dilatation or surrounding inflammatory changes. Spleen: Normal in size without significant abnormality. Adrenals/Urinary Tract: Adrenal glands are unremarkable. Kidneys are normal, without renal calculi, solid lesion, or hydronephrosis. Bladder is unremarkable. Stomach/Bowel: Stomach is within normal limits. Appendix appears normal. No evidence of bowel wall thickening, distention, or inflammatory changes. Redemonstrated postoperative findings of multiple bowel resections and reanastomosis. Lymphatic: No enlarged abdominal or pelvic lymph nodes. Reproductive: Uterine fibroids. Other: No abdominal wall hernia or abnormality. No abdominopelvic ascites. Musculoskeletal: No acute or significant osseous findings. IMPRESSION: 1. Redemonstrated, partially imaged stent endograft repair of the thoracic and upper abdominal aorta, with stenting of the abdominal aortic branch vessel origins. 2. 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 Excluded aneurysm sac diameter is not significantly changed compared to recent prior examination, measuring approximately 6.3 x 5.8 cm. 3. 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 4. 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 5. No contrast extravasation into the bowel lumen to suggest nidus of reported GI bleeding. 6. Cholelithiasis without evidence of cholecystitis. 7. Redemonstrated postoperative findings of multiple bowel resections and reanastomosis. Electronically Signed   By: Lauralyn Primes M.D.   On: 04/16/2021 16:06     EKG: Independently reviewed, with result as described  above.    Assessment/Plan   Jamie Roman is a 60 y.o. 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    Principal Problem:   Acute upper GI bleed Active Problems:   HTN (hypertension), benign   Acute blood loss anemia   COPD (chronic obstructive pulmonary disease) (HCC)   HLD (hyperlipidemia)   Tobacco abuse      #) Acute Upper GI Bleed: diagnosis on the basis of 3 to 4 days of melena, with elevated BUN of 39 relative to most recent prior value of 20.  On daily baby aspirin as an outpatient, with most recent  prior dose on the morning of 04/16/2021, but otherwise on no additional blood thinners or formal anticoagulation at home. Denies NSAID use. No known history of known underlying liver disease, and denies any routine or recent alcohol consumption. Has history of GERD, and acknowledges good compliance with outpatient Protonix.  Per my discussion with she does not believe that she has previously undergone EGD evaluation and denies any known prior history of GI bleed.   Differential, chronic, includes but is not limited to peptic ulcer disease, gastritis, erosive esophagitis, AVMs.  Of note, in the setting of recent thoracoabdominal aorta aneurysm repair at [facility], patient's case and imaging were discussed with vascular surgery at [facility], who were able to bring up and compare today's CTA relative to that performed on 03/01/2021, and noted interval improvement in endoleak of distal descending thoracic aorta relative to CTA on 03/01/21. [facility] vascular does not suspect any contribution towards presenting acute upper GI bleed from this recent history of AAA repair, does not recommend further vascular surgery evaluation, but rather recs standing acute upper  GI work-up.  Consequently, vascular surgery at [facility] conveys that there is no indication for transfer at this time.   In the absence of known liver disease, initiation of SBP prophylaxis does not appear to be warranted. Presentation and history are less suggestive of variceal bleed, and therefore there does not appear to be an indication for octreotide.    At this time, the patient appears hemodynamically stable, with normotensive blood pressures in the absence of any associated tachycardia. Presentation appears to be associated with acute blood loss anemia, as further quantified below, with transfusion of 2 units PRBC initiated in the ED, as further described below.  Patient asymptomatic at this time, with last episode of melena occurring on the morning of 04/16/2021. Of note, patient was typed and screened in the ED today. Given suspected upper GI source, Protonix drip initiated.   I consulted and discussed over the phone the patient's case with the on-call gastroenterologist, Dr Maximino Greenland. Per these discussions, there is not appear to be an indication at this time for urgent overnight EGD. Patient to be NPO, and GI to further evaluate in the morning.     Plan: NPO. Refraining from pharmacologic DVT prophylaxis. Monitor on telemetry. Monitor continuous pulse-ox. Maintain at least 2 large bore IV's.  Follow for result of INR, as well as repeat INR in the AM. Q4H H&H's have been ordered through 9 AM on 04/17/2021. Will closely monitor these ensuing Hgb levels and correlate these data points with the patient's overall clinical picture including vital signs to determine need for subsequent transfusion beyond the 2 units PRBC that she is currently being transfused.  Protonix drip, as above.  Holding home aspirin.  Gastroenterology formally consulted, as above.  Recheck BMP and CBC in the morning.       #) Acute blood loss anemia: in the setting of presenting acute upper GI bleed, presenting Hgb noted to be  5.1 relative to most recent prior value of 9.9 on 03/28/2021 relative to baseline hemoglobin range of 10-12. At this time, patient appears hemodynamically stable and asymptomatic, as further described above.  However, in the setting of upper GI bleed, and five-point drop in hemoglobin over the last 3 weeks, the patient has been typed and screened in the ED, with initiation of transfusion of 2 units PRBC, with each unit to be transfused over 1 hour.    Plan: work-up and management for presenting suspected acute upper GI  bleed, as above, including close monitoring of Q4H H&H's, including repeat H&H to be checked 30 minutes following completion of transfusion of second unit PRBC, with clinical evaluation for determination of need for additional blood transfusion, as further described above. Monitor on telemetry. Monitor continuous pulse-ox. NPO. Refraining from pharmacologic DVT prophylaxis. Check INR.  Holding home aspirin.  Add on the following to initial lab specimen collected in the ED today: total iron, TIBC, ferritin, MMA, folic acid level, reticulocyte count. Gastroenterology consulted, as above.         #) COPD: Documented history of such in the setting of long smoking history.  No evidence of acute exacerbation at this time.  Outpatient respiratory regimen consists of Symbicort as well as as needed albuterol inhaler.  Plan: Continue home Symbicort and as needed albuterol inhaler.  Counseled the patient on the importance of complete smoking discontinuation.  Repeat BMP in the morning.       #) Essential hypertension: Documented history of such, on Norvasc, Coreg, HCTZ, and losartan as an outpatient.  Blood pressure has been normotensive in the ED thus far.  In the setting of presenting acute upper gastrointestinal bleed with evidence of acute blood loss anemia, will hold home antihypertensive medications for now, pending further evaluation of acute upper GI bleed, as above.  Plan: Hold home  antihypertensive medications for now, as above.  Close monitoring of ensuing blood pressure via routine vital signs.      #) Hyperlipidemia: On high intensity atorvastatin as an outpatient.  Plan: Continue home statin.       #) Chronic tobacco abuse: Current smoker, having smoked half pack per day for at least the last 15 years.  Plan: Counseled the patient on the importance of complete smoking discontinuation, particularly in the setting of a underlying history of COPD as well as TAAA with recent repair, as above.     DVT prophylaxis: scd's  Code Status: Full code Family Communication: none Disposition Plan: Per Rounding Team Consults called: Dr. Maximino Greenland consulted, as further detailed above;  Admission status: Observation; stepdown     Of note, this patient was added by me to the following Admit List/Treatment Team: armcadmits.    Of note, the Adult Admission Order Set (Multimorbid order set) was used by me in the admission process for this patient.   PLEASE NOTE THAT DRAGON DICTATION SOFTWARE WAS USED IN THE CONSTRUCTION OF THIS NOTE.   Angie Fava DO [facility] Pager [phone] From 6PM - 2AM  Otherwise, please contact night-coverage  [email or web address] Password [facility]   04/16/2021, 6:46 PM

## 2021-04-16 NOTE — ED Provider Notes (Signed)
[facility] Emergency Department Provider Note  Time seen: 1:38 PM  I have reviewed the triage vital signs and the nursing notes.   HISTORY  Chief Complaint Melena   HPI Jamie Roman is a 60 y.o. female with a past medical history of a AAA, COPD, headache, hypertension, presents to the emergency department for generalized weakness.  According to the patient over the past 2 days or so she has been feeling extremely weak, states today her legs gave out and she could not stand due to weakness.  Patient is also noted for the past 2 to 3 days very black appearing stool.  Denies any abdominal pain but does state several months ago she had a AAA repair.  Denies any alcohol use.  No fever or cough.   Past Medical History:  Diagnosis Date   AAA (abdominal aortic aneurysm) (HCC)    Bowel obstruction (HCC)    COPD (chronic obstructive pulmonary disease) (HCC)    Headache(784.0)    Hypertension     Patient Active Problem List   Diagnosis Date Noted   VT (ventricular tachycardia) (HCC) 09/04/2019   Aneurysm of thoracic aorta (HCC) 08/31/2019   Bimalleolar ankle fracture, right, closed, initial encounter 08/30/2019   Closed right ankle fracture 08/30/2019   COPD with acute exacerbation (HCC) 08/30/2019   Acute respiratory failure with hypoxia (HCC) 08/30/2019   Alcohol dependence (HCC) 01/03/2012   HTN (hypertension), benign 01/03/2012    Past Surgical History:  Procedure Laterality Date   ABDOMINAL AORTIC ANEURYSM REPAIR     COLOSTOMY     COLOSTOMY REVERSAL     ORIF ANKLE FRACTURE Right 09/01/2019   Procedure: OPEN REDUCTION INTERNAL FIXATION (ORIF) ANKLE FRACTURE;  Surgeon: Juanell Fairly, MD;  Location: [facility] ORS;  Service: Orthopedics;  Laterality: Right;    Prior to Admission medications   Medication Sig Start Date End Date Taking? Authorizing Provider  acetaminophen (TYLENOL) 325 MG tablet Take 2 tablets (650 mg total) by mouth every 6 (six) hours  as needed for mild pain (or Fever >/= 101). 09/04/19   Lynn Ito, MD  acidophilus (RISAQUAD) CAPS capsule Take 2 capsules by mouth 2 (two) times daily. 09/04/19   Lynn Ito, MD  albuterol (VENTOLIN HFA) 108 (90 Base) MCG/ACT inhaler Inhale 2 puffs into the lungs every 6 (six) hours as needed for wheezing or shortness of breath. 09/04/19   Lynn Ito, MD  amLODipine (NORVASC) 10 MG tablet Take 1 tablet (10 mg total) by mouth daily. 01/05/12 08/30/19  Viyuoh, Rolland Bimler, MD  ASPIRIN LOW DOSE 81 MG EC tablet Take 81 mg by mouth daily. 08/10/19   [provider]  atorvastatin (LIPITOR) 40 MG tablet Take 40 mg by mouth at bedtime. 08/10/19   [provider]  carvedilol (COREG) 25 MG tablet Take 25 mg by mouth 2 (two) times daily. 07/29/19   [provider]  doxycycline (VIBRA-TABS) 100 MG tablet Take 1 tablet (100 mg total) by mouth every 12 (twelve) hours. 09/04/19   Lynn Ito, MD  enoxaparin (LOVENOX) 40 MG/0.4ML injection Inject 0.4 mLs (40 mg total) into the skin daily for 14 days. 09/04/19 09/18/19  Lynn Ito, MD  guaiFENesin (MUCINEX) 600 MG 12 hr tablet Take 1 tablet (600 mg total) by mouth 2 (two) times daily. 09/04/19   Lynn Ito, MD  hydrochlorothiazide (MICROZIDE) 12.5 MG capsule Take 12.5 mg by mouth daily. 08/06/19   [provider]  losartan (COZAAR) 100 MG tablet Take 100 mg by mouth  daily. 06/13/19   [provider]  pantoprazole (PROTONIX) 20 MG tablet Take 1 tablet (20 mg total) by mouth daily. 09/05/19   Lynn Ito, MD  SYMBICORT 80-4.5 MCG/ACT inhaler Inhale 2 puffs into the lungs 2 (two) times daily. 08/21/19   [provider]    No Known Allergies  No family history on file.  Social History Social History   Tobacco Use   Smoking status: Every Day    Packs/day: 0.50    Years: 15.00    Pack years: 7.50    Types: Cigarettes   Smokeless tobacco: Never  Substance Use Topics   Alcohol use: Yes    Comment: 1 quart    Review  of Systems Constitutional: Negative for fever. Cardiovascular: Negative for chest pain. Respiratory: Negative for shortness of breath. Gastrointestinal: Negative for abdominal pain.  Positive for black stool.  Negative for nausea or vomiting. Genitourinary: Negative for urinary compaints Musculoskeletal: Negative for musculoskeletal complaints Neurological: Negative for headache All other ROS negative  ____________________________________________   PHYSICAL EXAM:  VITAL SIGNS: ED Triage Vitals  Enc Vitals Group     BP 04/16/21 1124 (!) 92/46     Pulse Rate 04/16/21 1124 60     Resp 04/16/21 1124 18     Temp 04/16/21 1124 98.5 F (36.9 C)     Temp Source 04/16/21 1124 Oral     SpO2 04/16/21 1124 97 %     Weight 04/16/21 1121 115 lb (52.2 kg)     Height 04/16/21 1121 5\' 2"  (1.575 m)     Head Circumference --      Peak Flow --      Pain Score 04/16/21 1121 10     Pain Loc --      Pain Edu? --      Excl. in GC? --    Constitutional: Alert and oriented. Well appearing and in no distress. Eyes: Normal exam ENT      Head: Normocephalic and atraumatic.      Mouth/Throat: Mucous membranes are moist. Cardiovascular: Normal rate, regular rhythm.  Respiratory: Normal respiratory effort without tachypnea nor retractions. Breath sounds are clear  Gastrointestinal: Soft and nontender. No distention.   Musculoskeletal: Nontender with normal range of motion in all extremities.  Neurologic:  Normal speech and language. No gross focal neurologic deficits  Skin:  Skin is warm, dry and intact.  Psychiatric: Mood and affect are normal.   ____________________________________________    EKG  EKG viewed and interpreted by myself shows a normal sinus rhythm at 61 bpm with a narrow QRS, normal axis, normal intervals, no concerning ST changes.  ____________________________________________    RADIOLOGY  CT pending  ____________________________________________   INITIAL IMPRESSION /  ASSESSMENT AND PLAN / ED COURSE  Pertinent labs & imaging results that were available during my care of the patient were reviewed by me and considered in my medical decision making (see chart for details).   Patient presents to the emergency department for 2 to 3 days of weakness and black stool.  Patient has a benign abdominal exam.  Rectal examination shows dark black stool guaiac positive consistent with melena.  No nausea or vomiting.  Patient denies alcohol use.  We will start the patient on Protonix bolus and infusion.  Hemoglobin has resulted at 5.1 from a baseline of 10 we will begin transfusion of 2 units of PRBCs.  I have verbally consented the patient for these blood products.  Patient will require admission to the hospitalist  service once her emergency department work-up is been completed for further work-up and evaluation.  I will send a message to GI medicine regarding the patient as well.  Given the patient's recent AAA repair we will obtain CT imaging the abdomen with pelvis as a precaution, extremely low suspicion for fistulous tract.  CLEONE HULICK was evaluated in Emergency Department on 04/16/2021 for the symptoms described in the history of present illness. She was evaluated in the context of the global COVID-19 pandemic, which necessitated consideration that the patient might be at risk for infection with the SARS-CoV-2 virus that causes COVID-19. Institutional protocols and algorithms that pertain to the evaluation of patients at risk for COVID-19 are in a state of rapid change based on information released by regulatory bodies including the CDC and federal and state organizations. These policies and algorithms were followed during the patient's care in the ED.  CRITICAL CARE Performed by: Minna Antis   Total critical care time: 30 minutes  Critical care time was exclusive of separately billable procedures and treating other patients.  Critical care was necessary to  treat or prevent imminent or life-threatening deterioration.  Critical care was time spent personally by me on the following activities: development of treatment plan with patient and/or surrogate as well as nursing, discussions with consultants, evaluation of patient's response to treatment, examination of patient, obtaining history from patient or surrogate, ordering and performing treatments and interventions, ordering and review of laboratory studies, ordering and review of radiographic studies, pulse oximetry and re-evaluation of patient's condition.  ____________________________________________   FINAL CLINICAL IMPRESSION(S) / ED DIAGNOSES  GI bleed   Minna Antis, MD 04/17/21 2148

## 2021-04-16 NOTE — ED Provider Notes (Signed)
Clinical Course as of 04/16/21 1810  Sun Apr 16, 2021  1510 Patient received in signout from Dr. Lenard Lance.  Upper GI bleed with hemoglobin drop requiring transfusion.  Stable.  History of hypertension and AAA with multiple endovascular procedures associated with this, as recently as 6 weeks ago at [facility].  [DS]  1616 I reassessed the patient after CT and explained the results to her. [DS]  1618 Looks like recent graft repair was Dr. Karie Mainland, vascular [DS]  1627 Call from pt logistics, taking info, will page it out. Rads called for [employer] [DS]  1651 I discuss w vascular fellow at [facility], Dr  Mariah Milling, he reviews images and compares to last month. Leak is better. He'll talk to his attending.  [DS]  1753 No callback, will page [DS]  1808 Spoke with Asher Muir from transfer center. Mariah Milling spoke with his attending, no indication for transfer. Treat as GI bleed [DS]    Clinical Course User Index [DS] Delton Prairie, MD    Will admit here as upper gi bleed.   .Critical Care Performed by: Delton Prairie, MD Authorized by: Delton Prairie, MD   Critical care provider statement:    Critical care time (minutes):  45   Critical care was necessary to treat or prevent imminent or life-threatening deterioration of the following conditions:  Circulatory failure   Critical care was time spent personally by me on the following activities:  Discussions with consultants, evaluation of patient's response to treatment, examination of patient, ordering and performing treatments and interventions, ordering and review of laboratory studies, ordering and review of radiographic studies, pulse oximetry, re-evaluation of patient's condition, obtaining history from patient or surrogate and review of old charts     Delton Prairie, MD 04/16/21 1810

## 2021-04-17 ENCOUNTER — Inpatient Hospital Stay: Payer: Medicaid Other | Admitting: Anesthesiology

## 2021-04-17 ENCOUNTER — Encounter: Payer: Self-pay | Admitting: Internal Medicine

## 2021-04-17 ENCOUNTER — Encounter
Admission: EM | Discharge status: Against Medical Advice | Payer: Self-pay | Source: Home / Self Care | Attending: Internal Medicine

## 2021-04-17 DIAGNOSIS — K922 Gastrointestinal hemorrhage, unspecified: Secondary | ICD-10-CM | POA: Diagnosis present

## 2021-04-17 DIAGNOSIS — Z20822 Contact with and (suspected) exposure to covid-19: Secondary | ICD-10-CM | POA: Diagnosis present

## 2021-04-17 DIAGNOSIS — J439 Emphysema, unspecified: Secondary | ICD-10-CM | POA: Diagnosis not present

## 2021-04-17 DIAGNOSIS — Z79899 Other long term (current) drug therapy: Secondary | ICD-10-CM | POA: Diagnosis not present

## 2021-04-17 DIAGNOSIS — K5520 Angiodysplasia of colon without hemorrhage: Secondary | ICD-10-CM | POA: Diagnosis not present

## 2021-04-17 DIAGNOSIS — Z7982 Long term (current) use of aspirin: Secondary | ICD-10-CM | POA: Diagnosis not present

## 2021-04-17 DIAGNOSIS — K219 Gastro-esophageal reflux disease without esophagitis: Secondary | ICD-10-CM | POA: Diagnosis present

## 2021-04-17 DIAGNOSIS — E785 Hyperlipidemia, unspecified: Secondary | ICD-10-CM | POA: Diagnosis present

## 2021-04-17 DIAGNOSIS — R944 Abnormal results of kidney function studies: Secondary | ICD-10-CM | POA: Diagnosis present

## 2021-04-17 DIAGNOSIS — T82310A Breakdown (mechanical) of aortic (bifurcation) graft (replacement), initial encounter: Secondary | ICD-10-CM | POA: Diagnosis present

## 2021-04-17 DIAGNOSIS — I10 Essential (primary) hypertension: Secondary | ICD-10-CM | POA: Diagnosis present

## 2021-04-17 DIAGNOSIS — K921 Melena: Secondary | ICD-10-CM

## 2021-04-17 DIAGNOSIS — K449 Diaphragmatic hernia without obstruction or gangrene: Secondary | ICD-10-CM | POA: Diagnosis present

## 2021-04-17 DIAGNOSIS — Z8679 Personal history of other diseases of the circulatory system: Secondary | ICD-10-CM | POA: Diagnosis not present

## 2021-04-17 DIAGNOSIS — F17210 Nicotine dependence, cigarettes, uncomplicated: Secondary | ICD-10-CM | POA: Diagnosis present

## 2021-04-17 DIAGNOSIS — Z7951 Long term (current) use of inhaled steroids: Secondary | ICD-10-CM | POA: Diagnosis not present

## 2021-04-17 DIAGNOSIS — K31819 Angiodysplasia of stomach and duodenum without bleeding: Secondary | ICD-10-CM | POA: Diagnosis present

## 2021-04-17 DIAGNOSIS — J449 Chronic obstructive pulmonary disease, unspecified: Secondary | ICD-10-CM | POA: Diagnosis present

## 2021-04-17 DIAGNOSIS — Y712 Prosthetic and other implants, materials and accessory cardiovascular devices associated with adverse incidents: Secondary | ICD-10-CM | POA: Diagnosis present

## 2021-04-17 DIAGNOSIS — D62 Acute posthemorrhagic anemia: Secondary | ICD-10-CM | POA: Diagnosis present

## 2021-04-17 HISTORY — PX: ESOPHAGOGASTRODUODENOSCOPY (EGD) WITH PROPOFOL: SHX5813

## 2021-04-17 LAB — MAGNESIUM
Magnesium: 2.2 mg/dL (ref 1.7–2.4)
Magnesium: 2 mg/dL (ref 1.7–2.4)

## 2021-04-17 LAB — COMPREHENSIVE METABOLIC PANEL
ALT: 8 U/L (ref 0–44)
AST: 16 U/L (ref 15–41)
Albumin: 2.8 g/dL — ABNORMAL LOW (ref 3.5–5.0)
Alkaline Phosphatase: 61 U/L (ref 38–126)
Anion gap: 6 (ref 5–15)
BUN: 26 mg/dL — ABNORMAL HIGH (ref 8–23)
CO2: 22 mmol/L (ref 22–32)
Calcium: 8.8 mg/dL — ABNORMAL LOW (ref 8.9–10.3)
Chloride: 114 mmol/L — ABNORMAL HIGH (ref 98–111)
Creatinine, Ser: 0.81 mg/dL (ref 0.44–1.00)
GFR, Estimated: >60 mL/min (ref >60)
Glucose, Bld: 92 mg/dL (ref 70–99)
Potassium: 3.8 mmol/L (ref 3.5–5.1)
Sodium: 142 mmol/L (ref 135–145)
Total Bilirubin: 0.8 mg/dL (ref 0.3–1.2)
Total Protein: 6.2 g/dL — ABNORMAL LOW (ref 6.5–8.1)

## 2021-04-17 LAB — CBC
HCT: 22 % — ABNORMAL LOW (ref 36.0–46.0)
Hemoglobin: 7.4 g/dL — ABNORMAL LOW (ref 12.0–15.0)
MCH: 27.4 pg (ref 26.0–34.0)
MCHC: 33.6 g/dL (ref 30.0–36.0)
MCV: 81.5 fL (ref 80.0–100.0)
Platelets: 247 10*3/uL (ref 150–400)
RBC: 2.7 MIL/uL — ABNORMAL LOW (ref 3.87–5.11)
RDW: 19.9 % — ABNORMAL HIGH (ref 11.5–15.5)
WBC: 7.8 10*3/uL (ref 4.0–10.5)
nRBC: 0 % (ref 0.0–0.2)

## 2021-04-17 LAB — FERRITIN: Ferritin: 47 ng/mL (ref 11–307)

## 2021-04-17 LAB — PROTIME-INR
INR: 1.1 (ref 0.8–1.2)
INR: 1 (ref 0.8–1.2)
Prothrombin Time: 14.4 seconds (ref 11.4–15.2)
Prothrombin Time: 13.5 seconds (ref 11.4–15.2)

## 2021-04-17 LAB — FOLATE: Folate: 22.7 ng/mL (ref >5.9)

## 2021-04-17 LAB — HEMOGLOBIN AND HEMATOCRIT, BLOOD
HCT: 20.8 % — ABNORMAL LOW (ref 36.0–46.0)
HCT: 24.7 % — ABNORMAL LOW (ref 36.0–46.0)
Hemoglobin: 6.9 g/dL — ABNORMAL LOW (ref 12.0–15.0)
Hemoglobin: 8.1 g/dL — ABNORMAL LOW (ref 12.0–15.0)

## 2021-04-17 LAB — IRON AND TIBC
Iron: 105 ug/dL (ref 28–170)
Saturation Ratios: 38 % — ABNORMAL HIGH (ref 10.4–31.8)
TIBC: 274 ug/dL (ref 250–450)
UIBC: 169 ug/dL

## 2021-04-17 SURGERY — ESOPHAGOGASTRODUODENOSCOPY (EGD) WITH PROPOFOL
Anesthesia: General

## 2021-04-17 MED ORDER — SODIUM CHLORIDE 0.9 % IV SOLN
INTRAVENOUS | Status: DC
  Administered 2021-04-17: 1000 mL via INTRAVENOUS

## 2021-04-17 MED ORDER — LIDOCAINE HCL (CARDIAC) PF 100 MG/5ML IV SOSY
PREFILLED_SYRINGE | INTRAVENOUS | Status: DC | PRN
  Administered 2021-04-17: 40 mg via INTRAVENOUS

## 2021-04-17 MED ORDER — SODIUM CHLORIDE 0.9% IV SOLUTION
Freq: Once | INTRAVENOUS | Status: DC
  Filled 2021-04-17: qty 250

## 2021-04-17 MED ORDER — DEXMEDETOMIDINE (PRECEDEX) IN NS 20 MCG/5ML (4 MCG/ML) IV SYRINGE
PREFILLED_SYRINGE | INTRAVENOUS | Status: DC | PRN
  Administered 2021-04-17: 12 ug via INTRAVENOUS

## 2021-04-17 MED ORDER — PROPOFOL 500 MG/50ML IV EMUL
INTRAVENOUS | Status: AC
  Filled 2021-04-17: qty 50

## 2021-04-17 MED ORDER — PROPOFOL 500 MG/50ML IV EMUL
INTRAVENOUS | Status: DC | PRN
  Administered 2021-04-17: 150 ug/kg/min via INTRAVENOUS

## 2021-04-17 MED ORDER — LIDOCAINE HCL (PF) 2 % IJ SOLN
INTRAMUSCULAR | Status: AC
  Filled 2021-04-17: qty 5

## 2021-04-17 MED ORDER — PROPOFOL 10 MG/ML IV BOLUS
INTRAVENOUS | Status: DC | PRN
  Administered 2021-04-17: 60 mg via INTRAVENOUS

## 2021-04-17 NOTE — ED Notes (Addendum)
Pt's daughter ,  [name] in room and updated on her mom's status. Insisted her mom be transferred to [facility] d/t h/o AAA repair and leak and that her mom is in a study.  Pt's daughter made aware on Pt's status at this time and the plan of treatment. Made aware ERMD per notes has spoken to [facility] recommends "no indication for transfer, treat GI bleed "

## 2021-04-17 NOTE — Anesthesia Preprocedure Evaluation (Addendum)
Anesthesia Evaluation  Patient identified by MRN, date of birth, ID band Patient awake    Reviewed: Allergy & Precautions, NPO status , Patient's Chart, lab work & pertinent test results  Airway Mallampati: III       Dental  (+) Edentulous Upper, Edentulous Lower   Pulmonary COPD,  COPD inhaler, Current Smoker and Patient abstained from smoking.,    Pulmonary exam normal        Cardiovascular Exercise Tolerance: Poor hypertension, Pt. 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  ED provider had spoken with the vascular surgery at [facility] and noted interval improvement in endoleak compared to CTA 03/01/2021.  [facility] vascular surgery did not think that her GI bleed was related to AAA repair. In the ED, patient was given Protonix 80 mg, type and screen was done and patient received 2 units of packed RBC.  Reproductive/Obstetrics                           Anesthesia Physical  Anesthesia Plan  ASA: 4  Anesthesia Plan: General   Post-op Pain Management:    Induction: Intravenous  PONV Risk Score and Plan: Treatment may vary due to age or medical condition and TIVA  Airway Management  Planned: Nasal Cannula and Natural Airway  Additional Equipment:   Intra-op Plan:   Post-operative Plan:   Informed Consent: I have reviewed the patients History and Physical, chart, labs and discussed the procedure including the risks, benefits and alternatives for the proposed anesthesia with the patient or authorized representative who has indicated his/her understanding and acceptance.     Dental advisory given  Plan Discussed with: CRNA and Surgeon  Anesthesia Plan Comments:         Anesthesia Quick Evaluation

## 2021-04-17 NOTE — Consult Note (Signed)
Wyline Mood , MD [address], [place], [place], [address] [address], [address], [place], [place], [address] Phone: [phone]  Fax: [phone]  Consultation  Referring Provider:    Dr Arlean Hopping Primary Care Physician:  Inc, [facility] Primary Gastroenterologist:  Dr. Stevphen Rochester Reason for Consultation:     GI bleed  Date of Admission:  04/16/2021 Date of Consultation:  04/17/2021         HPI:   Jamie Roman is a 60 y.o. female With a history of thoracoabdominal aortic aneurysm repair status post graft at [facility], high blood pressure, COPD presented on 04/16/2021 complaining of tarry black stools which she states has been going on for 3 days.  Last episode of the night.  She denies any use of NSAIDs.  Denies any hematemesis.  Has some lower abdominal cramping.  Is on Plavix and last dose was taken on Sunday morning.  No similar complaint before.  She wants to eat and drink.  In the ER found to have a BUN of 39 and a creatinine of 1.0.  Hemoglobin 5.1 g with an MCV of 78.  Ferritin of 47, elevated iron percentage saturation of 38.  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  No extravasation of contrast into the bowel lumen to suggest a fistula.  Repeat hemoglobin checked this morning 6.9 g.  Baseline hemoglobin 10.5 g about a month back.  Patient was commenced on Protonix and given 2 units of blood transfusion.Platelet count of 272 and CMP grossly within normal limits.    Past Medical History:  Diagnosis Date   AAA (abdominal aortic aneurysm) (HCC)    Acute blood loss anemia 04/16/2021   Bowel obstruction (HCC)    COPD (chronic obstructive pulmonary disease) (HCC)    Headache(784.0)    Hypertension     Past Surgical History:  Procedure Laterality Date   ABDOMINAL AORTIC ANEURYSM REPAIR     COLOSTOMY     COLOSTOMY REVERSAL     ORIF ANKLE FRACTURE Right 09/01/2019    Procedure: OPEN REDUCTION INTERNAL FIXATION (ORIF) ANKLE FRACTURE;  Surgeon: Juanell Fairly, MD;  Location: [facility] ORS;  Service: Orthopedics;  Laterality: Right;    Prior to Admission medications   Medication Sig Start Date End Date Taking? Authorizing Provider  acetaminophen (TYLENOL) 325 MG tablet Take 2 tablets (650 mg total) by mouth every 6 (six) hours as needed for mild pain (or Fever >/= 101). 09/04/19  Yes Lynn Ito, MD  acidophilus (RISAQUAD) CAPS capsule Take 2 capsules by mouth 2 (two) times daily. 09/04/19  Yes Lynn Ito, MD  albuterol (VENTOLIN HFA) 108 (90 Base) MCG/ACT inhaler Inhale 2 puffs into the lungs every 6 (six) hours as needed for wheezing or shortness of breath. 09/04/19  Yes Lynn Ito, MD  ASPIRIN LOW DOSE 81 MG EC tablet Take 81 mg by mouth daily. 08/10/19  Yes [provider]  atorvastatin (LIPITOR) 80 MG tablet Take 80 mg by mouth daily. 10/31/20  Yes [provider]  carvedilol (COREG) 25 MG tablet Take 25 mg by mouth 2 (two) times daily. 07/29/19  Yes [provider]  clopidogrel (PLAVIX) 75 MG tablet Take 75 mg by mouth daily. 03/28/21  Yes [provider]  fluticasone-salmeterol (ADVAIR) 250-50 MCG/ACT AEPB Inhale 1 puff into the lungs 2 (two) times daily. 11/04/20  Yes [provider]  hydrochlorothiazide (MICROZIDE) 12.5 MG capsule Take 12.5 mg  by mouth daily. 08/06/19  Yes [provider]  latanoprost (XALATAN) 0.005 % ophthalmic solution 1 drop at bedtime. 03/29/21  Yes [provider]  losartan (COZAAR) 100 MG tablet Take 100 mg by mouth daily. 06/13/19  Yes [provider]  Oxycodone HCl 10 MG TABS Take 10 mg by mouth every 4 (four) hours as needed. 12/23/20  Yes [provider]  SPIRIVA HANDIHALER 18 MCG inhalation capsule 1 capsule daily. 03/27/21  Yes [provider]  SYMBICORT 80-4.5 MCG/ACT inhaler Inhale 2 puffs into the lungs 2 (two) times daily. 08/21/19  Yes [provider]  amLODipine (NORVASC) 10 MG tablet Take 1 tablet (10 mg total) by mouth daily. 01/05/12 08/30/19  Viyuoh, Rolland Bimler, MD  atorvastatin (LIPITOR) 40 MG tablet Take 40 mg by mouth at bedtime. Patient not taking: Reported on 04/16/2021 08/10/19   [provider]  doxycycline (VIBRA-TABS) 100 MG tablet Take 1 tablet (100 mg total) by mouth every 12 (twelve) hours. Patient not taking: No sig reported 09/04/19   Lynn Ito, MD  enoxaparin (LOVENOX) 40 MG/0.4ML injection Inject 0.4 mLs (40 mg total) into the skin daily for 14 days. 09/04/19 09/18/19  Lynn Ito, MD  guaiFENesin (MUCINEX) 600 MG 12 hr tablet Take 1 tablet (600 mg total) by mouth 2 (two) times daily. Patient not taking: No sig reported 09/04/19   Lynn Ito, MD  pantoprazole (PROTONIX) 20 MG tablet Take 1 tablet (20 mg total) by mouth daily. Patient not taking: No sig reported 09/05/19   Lynn Ito, MD    History reviewed. No pertinent family history.   Social History   Tobacco Use   Smoking status: Every Day    Packs/day: 0.50    Years: 15.00    Pack years: 7.50    Types: Cigarettes   Smokeless tobacco: Never  Substance Use Topics   Alcohol use: Not Currently    Comment: (denies any regular or recent alcohol consumption)    Allergies as of 04/16/2021   (No Known Allergies)    Review of Systems:    All systems reviewed and negative except where noted in HPI.   Physical Exam:  Vital signs in last 24 hours: Temp:  [97.1 F (36.2 C)-98.5 F (36.9 C)] 98.3 F (36.8 C) (09/19 0630) Pulse Rate:  [58-82] 72 (09/19 0715) Resp:  [14-22] 19 (09/19 0645) BP: (92-177)/(46-66) 177/59 (09/19 0715) SpO2:  [88 %-99 %] 99 % (09/19 0715) Weight:  [52.2 kg] 52.2 kg (09/18 1121)   General:   Pleasant, cooperative in NAD Head:  Normocephalic and atraumatic. Eyes:   No icterus.   Conjunctiva pink. PERRLA. Ears:  Normal auditory acuity. Neck:  Supple; no masses or thyroidomegaly Lungs: Respirations even and  unlabored. Lungs clear to auscultation bilaterally.   No wheezes, crackles, or rhonchi.  Heart:  Regular rate and rhythm;  Without murmur, clicks, rubs or gallops Abdomen:  Soft, nondistended, nontender. Normal bowel sounds. No appreciable masses or hepatomegaly.  No rebound or guarding.  Neurologic:  Alert and oriented x3;  grossly normal neurologically. Skin:  Intact without significant lesions or rashes. Cervical Nodes:  No significant cervical adenopathy. Psych:  Alert and cooperative. Normal affect.  LAB RESULTS: Recent Labs    04/16/21 1134 04/17/21 0258  WBC 6.1  --   HGB 5.1* 6.9*  HCT 16.5* 20.8*  PLT 272  --    BMET Recent Labs    04/16/21 1134  NA 139  K 4.0  CL 107  CO2  25  GLUCOSE 123*  BUN 39*  CREATININE 1.00  CALCIUM 8.8*   LFT Recent Labs    04/16/21 1134  PROT 6.4*  ALBUMIN 3.0*  AST 17  ALT 9  ALKPHOS 67  BILITOT 0.4   PT/INR Recent Labs    04/17/21 0258  LABPROT 14.4  INR 1.1    STUDIES: CT ANGIO GI BLEED  Result Date: 04/16/2021 CLINICAL DATA:  Black stools for 2 days, blood thinners, weakness, history of aortic dissection with stent endograft repair EXAM: CTA ABDOMEN AND PELVIS WITHOUT AND WITH CONTRAST TECHNIQUE: Multidetector CT imaging of the abdomen and pelvis was performed using the standard protocol during bolus administration of intravenous contrast. Multiplanar reconstructed images and MIPs were obtained and reviewed to evaluate the vascular anatomy. CONTRAST:  78mL OMNIPAQUE IOHEXOL 350 MG/ML SOLN COMPARISON:  CT chest abdomen pelvis angiogram, 03/01/2021 FINDINGS: VASCULAR Redemonstrated, partially imaged descending stent endograft repair of the thoracic and upper abdominal aorta, with stenting of the aortic branch vessel origins. 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 The abdominal aorta is nonaneurysmal. 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 Excluded aneurysm sac diameter is not significantly changed compared to recent prior examination, measuring approximately 6.3 x 5.8 cm. 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 Review of the MIP images confirms the above findings. NON-VASCULAR Lower chest: Cardiomegaly Hepatobiliary: No solid liver abnormality is seen. Small gallstones in the gallbladder fundus. Gallbladder wall thickening, or biliary dilatation. Pancreas: Unremarkable. No pancreatic ductal dilatation or surrounding inflammatory changes. Spleen: Normal in size without significant abnormality. Adrenals/Urinary Tract: Adrenal glands are unremarkable. Kidneys are normal, without renal calculi, solid lesion, or hydronephrosis. Bladder is unremarkable. Stomach/Bowel: Stomach is within normal limits. Appendix appears normal. No evidence of bowel wall thickening, distention, or inflammatory changes. Redemonstrated postoperative findings of multiple bowel resections and reanastomosis. Lymphatic: No enlarged abdominal or pelvic lymph nodes. Reproductive: Uterine fibroids. Other: No abdominal wall hernia or abnormality. No abdominopelvic ascites. Musculoskeletal: No acute or significant osseous findings. IMPRESSION: 1. Redemonstrated, partially imaged stent endograft repair of the thoracic and upper abdominal aorta, with stenting of the abdominal aortic branch vessel origins. 2. 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 Excluded aneurysm sac diameter is not significantly changed compared to recent prior examination, measuring approximately 6.3 x 5.8 cm. 3. 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 4. 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 5. No contrast extravasation into the bowel lumen to suggest nidus of reported GI bleeding. 6. Cholelithiasis without  evidence of cholecystitis. 7. Redemonstrated postoperative findings of multiple bowel resections and reanastomosis. Electronically Signed   By: Lauralyn Primes M.D.   On: 04/16/2021 16:06      Impression / Plan:   Jamie Roman is a 60 y.o. y/o female with a history of graft for a thoracoabdominal aortic aneurysm repair.  Presents with melena hemoglobin 5.1 g drop from baseline of over 10 g.  Elevated BUN/creatinine ratio.  CT angiogram did not show any fistulous leak from the aneurysm.   Plan 1.  Continue to monitor CBC and transfuse as needed. 2.  Continue IV Protonix. 3.  Serial abdominal exams if concerns for change in the examination, would have low threshold to repeat imaging  otherwise we will proceed with EGD  today.  I have discussed with Dr. Servando Snare will be performing the procedure later today.  Despite being on Plavix I think the benefits of the procedure would exceed the risks at this point of time.  The main intention would be to see if there is any issue from the graft that is eroding into the GI tract.  If there is any interval concern for a significantly would need a tagged RBC scan  4.  Aim for hemoglobin over 7 g.  I have discussed alternative options, risks & benefits,  which include, but are not limited to, bleeding, infection, perforation,respiratory complication & drug reaction.  The patient agrees with this plan & written consent will be obtained.     Thank you for involving me in the care of this patient.      LOS: 0 days   Wyline Mood, MD  04/17/2021, 7:33 AM

## 2021-04-17 NOTE — ED Notes (Addendum)
RN to bedside to introduce self to pt. Pt is awake and alert and oriented. No signs of distress. Blood running at 75ML/hour.

## 2021-04-17 NOTE — OR Nursing (Signed)
Pt. In recovery room awake and alert waiting for a bed; She has had 2 blackish maroon colored stools,. I called dr.wohl to make him aware, VITAL SIGNS ARE STABLE.

## 2021-04-17 NOTE — ED Notes (Signed)
Pt c/o pain to IV site,  IV access re assessed. Flushes easily, no infiltration/swelling noted. BT restarted at lower rate , will monitor and give hand off to AM RN

## 2021-04-17 NOTE — ED Notes (Signed)
Blood restarted.

## 2021-04-17 NOTE — ED Notes (Addendum)
RN to bedside to answer call light. Pt left upper arm very swollen. IV is infiltrated. Clever IV now. Will call IV team for restart.   IV Blood paused will start back ASAP when IV team restarts new IV. Bag started at 0630 so good until 1030.

## 2021-04-17 NOTE — Anesthesia Postprocedure Evaluation (Signed)
Anesthesia Post Note  Patient: Jamie Roman  Procedure(s) Performed: ESOPHAGOGASTRODUODENOSCOPY (EGD) WITH PROPOFOL  Patient location during evaluation: Endoscopy Anesthesia Type: General Level of consciousness: awake and alert Pain management: pain level controlled Vital Signs Assessment: post-procedure vital signs reviewed and stable Respiratory status: spontaneous breathing, nonlabored ventilation and respiratory function stable Cardiovascular status: blood pressure returned to baseline and stable Postop Assessment: no apparent nausea or vomiting Anesthetic complications: no   No notable events documented.   Last Vitals:  Vitals:   04/17/21 2010 04/17/21 2011  BP: (!) 162/63   Pulse: 86   Resp: 16   Temp:  36.8 C  SpO2: 95%     Last Pain:  Vitals:   04/17/21 2011  TempSrc: Oral  PainSc:                  [name]

## 2021-04-17 NOTE — OR Nursing (Signed)
Report to [employer] rn.

## 2021-04-17 NOTE — Progress Notes (Signed)
Pt with orders  for STAT GI scan by nuclear medicine. No one on call assigned to [facility]. Called [facility] [employer] tech who said they don't do [facility]. Dr Servando Snare informed via secure chat that scan may not happen tonight. I will keep trying to get hold of someone.

## 2021-04-17 NOTE — Progress Notes (Signed)
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  She had past a significant amount of old blood from her rectum after the procedure and insufflation of the GI tract with air for the upper endoscopy but her hemoglobin has gone down since her transfusion.  I will be ordering a stat GI bleeding scan to see if the source of the bleeding can be ascertained.

## 2021-04-17 NOTE — Op Note (Signed)
[facility] Gastroenterology Patient Name: Ife Vitelli Procedure Date: 04/17/2021 3:00 PM MRN: [id number] Account #: [id number] Date of Birth: 11-21-1960 Admit Type: Outpatient Age: 60 Room: [facility] ENDO ROOM 2 Gender: Female Note Status: Finalized Instrument Name: Upper Endoscope [id number] Procedure:             Upper GI endoscopy Indications:           Melena Providers:             Midge Minium MD, MD Referring MD:          No Local Md, MD (Referring MD) Medicines:             Propofol per Anesthesia Complications:         No immediate complications. Procedure:             Pre-Anesthesia Assessment:                        - Prior to the procedure, a History and Physical was                         performed, and patient medications and allergies were                         reviewed. The patient's tolerance of previous                         anesthesia was also reviewed. The risks and benefits                         of the procedure and the sedation options and risks                         were discussed with the patient. All questions were                         answered, and informed consent was obtained. Prior                         Anticoagulants: The patient has taken no previous                         anticoagulant or antiplatelet agents. ASA Grade                         Assessment: II - A patient with mild systemic disease.                         After reviewing the risks and benefits, the patient                         was deemed in satisfactory condition to undergo the                         procedure.                        After obtaining informed consent, the endoscope was  passed under direct vision. Throughout the procedure,                         the patient's blood pressure, pulse, and oxygen                         saturations were monitored continuously. The Endoscope                         was introduced  through the mouth, and advanced to the                         second part of duodenum. The upper GI endoscopy was                         accomplished without difficulty. The patient tolerated                         the procedure well. Findings:      A small hiatal hernia was present.      The stomach was normal.      A few angiodysplastic lesions without bleeding were found in the second       portion of the duodenum. Coagulation for tissue destruction using argon       plasma at 2 liters/minute and 30 watts was successful. Impression:            - Small hiatal hernia.                        - Normal stomach.                        - A few non-bleeding angiodysplastic lesions in the                         duodenum. Treated with argon plasma coagulation (APC).                        - No specimens collected. Recommendation:        - Return patient to hospital ward for ongoing care.                        - Clear liquid diet.                        - Continue present medications. Procedure Code(s):     --- Professional ---                        [phone], Esophagogastroduodenoscopy, flexible,                         transoral; with ablation of tumor(s), polyp(s), or                         other lesion(s) (includes pre- and post-dilation and                         guide wire passage, when performed) Diagnosis Code(s):     --- Professional ---  K92.1, Melena (includes Hematochezia)                        K31.819, Angiodysplasia of stomach and duodenum                         without bleeding CPT copyright 2019 American Medical Association. All rights reserved. The codes documented in this report are preliminary and upon coder review may  be revised to meet current compliance requirements. Midge Minium MD, MD 04/17/2021 4:46:57 PM This report has been signed electronically. Number of Addenda: 0 Note Initiated On: 04/17/2021 3:00 PM Estimated Blood Loss:  Estimated  blood loss: none.      [facility]

## 2021-04-17 NOTE — ED Notes (Signed)
ENDO called for report. Report given.

## 2021-04-17 NOTE — Transfer of Care (Signed)
Immediate Anesthesia Transfer of Care Note  Patient: Jamie Roman  Procedure(s) Performed: ESOPHAGOGASTRODUODENOSCOPY (EGD) WITH PROPOFOL  Patient Location: Endoscopy Unit  Anesthesia Type:General  Level of Consciousness: awake and alert   Airway & Oxygen Therapy: Patient Spontanous Breathing and Patient connected to nasal cannula oxygen  Post-op Assessment: Report given to RN and Post -op Vital signs reviewed and stable  Post vital signs: Reviewed and stable  Last Vitals:  Vitals Value Taken Time  BP 127/52 04/17/21 1649  Temp    Pulse 79 04/17/21 1649  Resp 20 04/17/21 1649  SpO2 96 % 04/17/21 1649  Vitals shown include unvalidated device data.  Last Pain:  Vitals:   04/17/21 1503  TempSrc:   PainSc: 0-No pain      Patients Stated Pain Goal: 2 (04/17/21 1503)  Complications: No notable events documented.

## 2021-04-17 NOTE — Progress Notes (Addendum)
PROGRESS NOTE  Jamie Roman FTD:[id number] DOB: [birth year] DOA: 04/16/2021 PCP: [facility]   LOS: 0 days   Brief narrative: Jamie Roman is a 60 y.o. 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  No previous history of GI bleed but takes baby aspirin.  No recent use of NSAIDs. 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 Karie Mainland of vascular surgery.  At baseline, patient's hemoglobin is between 10-12.  EKG showed a sinus rhythm.  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  ED provider had spoken with the vascular surgery at [facility] and noted interval improvement in endoleak compared to CTA 03/01/2021.  [facility] vascular surgery did not think that her GI bleed was related to AAA repair.  No transfer was requested.  In the ED, patient was given Protonix 80 mg, type and screen was done and patient received 3 units of packed RBC.  Patient was then admitted to hospital for further evaluation and treatment.  Assessment/Plan:  Principal Problem:   Acute upper GI bleed Active Problems:   HTN (hypertension), benign   Acute blood loss anemia   COPD (chronic obstructive pulmonary disease) (HCC)   HLD (hyperlipidemia)   Tobacco abuse   Acute Upper GI Bleed:  Ongoing GI bleed.  Latest hemoglobin of 6.9.  Patient is getting third unit of PRBC.  Was on aspirin and Plavix as outpatient.  GI has been consulted.  Will likely need endoscopic evaluation.  Hemodynamically stable so far.  Continue NPO.  IV fluids.  IV Protonix.    Acute blood loss anemia:  Continue PRBC transfusion.  Monitor CBC closely.  Follow GI recommendations.    COPD:  Continue Symbicort as well as as needed  albuterol inhaler.  Essential hypertension:  Norvasc Coreg HCTZ and losartan on hold at this time.       Hyperlipidemia:  Continue Lipitor.    Chronic tobacco abuse: Currently smoking.  Counseled about cessation.    DVT prophylaxis: SCDs Start: 04/16/21 1844   Code Status: Full  Family Communication: None  Status is: Observation  The patient will require care spanning > 2 midnights and should be moved to inpatient because: IV treatments appropriate due to intensity of illness or inability to take PO and Inpatient level of care appropriate due to severity of illness  Dispo: The patient is from: Home              Anticipated d/c is to: Home              Patient currently is not medically stable to d/c.   Difficult to place patient No Consultants: GI  Procedures: PRBC transfusion  Anti-infectives:  None  Anti-infectives (From admission, onward)    None      Subjective: Today, patient was seen and examined at bedside.  Still complains of black tarry stools.  Only some mild back pain.  Objective: Vitals:   04/17/21 0715 04/17/21 0815  BP: (!) 177/59 (!) 176/67  Pulse: 72 69  Resp:  16  Temp:    SpO2: 99% 98%    Intake/Output Summary (Last 24 hours) at 04/17/2021 1113 Last data filed at 04/17/2021 0025 Gross per 24 hour  Intake 1359.83 ml  Output --  Net 1359.83 ml   Filed Weights   04/16/21 1121  Weight: 52.2 kg   Body mass index is 21.03 kg/m.   Physical Exam: GENERAL: Patient is alert awake and oriented. Not in obvious distress. HENT: Mild pallor noted ,pupils equally reactive to light. Oral mucosa is moist NECK: is supple, no gross swelling noted. CHEST: Clear to auscultation. No crackles or wheezes.  Diminished breath sounds bilaterally. CVS: S1 and S2 heard, no murmur. Regular rate and rhythm.  ABDOMEN: Soft, non-tender, bowel sounds are present. EXTREMITIES: No edema. CNS: Cranial nerves are intact. No focal motor deficits. SKIN: warm and  dry without rashes.  Data Review: I have personally reviewed the following laboratory data and studies,  CBC: Recent Labs  Lab 04/16/21 1134 04/17/21 0258  WBC 6.1  --   HGB 5.1* 6.9*  HCT 16.5* 20.8*  MCV 78.2*  --   PLT 272  --    Basic Metabolic Panel: Recent Labs  Lab 04/16/21 1134 04/17/21 0258  NA 139  --   K 4.0  --   CL 107  --   CO2 25  --   GLUCOSE 123*  --   BUN 39*  --   CREATININE 1.00  --   CALCIUM 8.8*  --   MG  --  2.2   Liver Function Tests: Recent Labs  Lab 04/16/21 1134  AST 17  ALT 9  ALKPHOS 67  BILITOT 0.4  PROT 6.4*  ALBUMIN 3.0*   No results for input(s): LIPASE, AMYLASE in the last 168 hours. No results for input(s): AMMONIA in the last 168 hours. Cardiac Enzymes: No results for input(s): CKTOTAL, CKMB, CKMBINDEX, TROPONINI in the last 168 hours. BNP (last 3 results) No results for input(s): BNP in the last 8760 hours.  ProBNP (last 3 results) No results for input(s): PROBNP in the last 8760 hours.  CBG: No results for input(s): GLUCAP in the last 168 hours. Recent Results (from the past 240 hour(s))  Resp Panel by RT-PCR (Flu A&B, Covid) Nasopharyngeal Swab     Status: None   Collection Time: 04/16/21  9:08 PM   Specimen: Nasopharyngeal Swab; Nasopharyngeal(NP) swabs in vial transport medium  Result Value Ref Range Status   SARS Coronavirus 2 by RT PCR NEGATIVE NEGATIVE Final    Comment: (NOTE) SARS-CoV-2 target nucleic acids are NOT DETECTED.  The SARS-CoV-2 RNA is generally detectable in upper respiratory specimens during the acute phase of infection. The lowest concentration of SARS-CoV-2 viral copies this assay can detect is 138 copies/mL. A negative result does not preclude SARS-Cov-2 infection and should not be used as the sole basis for treatment or other patient management decisions. A negative result may occur with  improper specimen collection/handling, submission of specimen other than nasopharyngeal swab,  presence of viral mutation(s) within the areas targeted by this assay, and inadequate number of viral copies(<138 copies/mL). A negative result must be combined with clinical observations, patient history, and epidemiological information. The expected result is Negative.  Fact Sheet for Patients:  BloggerCourse.com  Fact Sheet for Healthcare Providers:  SeriousBroker.it  This test is no t yet approved or cleared by the [place] FDA and  has been authorized for detection and/or diagnosis of SARS-CoV-2 by FDA under an Emergency Use Authorization (EUA). This EUA will remain  in effect (meaning this test can be used) for the duration of the COVID-19 declaration under Section 564(b)(1) of the Act, 21 U.S.C.section 360bbb-3(b)(1), unless the authorization is terminated  or revoked  sooner.       Influenza A by PCR NEGATIVE NEGATIVE Final   Influenza B by PCR NEGATIVE NEGATIVE Final    Comment: (NOTE) The Xpert Xpress SARS-CoV-2/FLU/RSV plus assay is intended as an aid in the diagnosis of influenza from Nasopharyngeal swab specimens and should not be used as a sole basis for treatment. Nasal washings and aspirates are unacceptable for Xpert Xpress SARS-CoV-2/FLU/RSV testing.  Fact Sheet for Patients: BloggerCourse.com  Fact Sheet for Healthcare Providers: SeriousBroker.it  This test is not yet approved or cleared by the [place] FDA and has been authorized for detection and/or diagnosis of SARS-CoV-2 by FDA under an Emergency Use Authorization (EUA). This EUA will remain in effect (meaning this test can be used) for the duration of the COVID-19 declaration under Section 564(b)(1) of the Act, 21 U.S.C. section 360bbb-3(b)(1), unless the authorization is terminated or revoked.  Performed at [facility], [address] [place], [place] [address]       Studies: CT ANGIO GI BLEED  Result Date: 04/16/2021 CLINICAL DATA:  Black stools for 2 days, blood thinners, weakness, history of aortic dissection with stent endograft repair EXAM: CTA ABDOMEN AND PELVIS WITHOUT AND WITH CONTRAST TECHNIQUE: Multidetector CT imaging of the abdomen and pelvis was performed using the standard protocol during bolus administration of intravenous contrast. Multiplanar reconstructed images and MIPs were obtained and reviewed to evaluate the vascular anatomy. CONTRAST:  80mL OMNIPAQUE IOHEXOL 350 MG/ML SOLN COMPARISON:  CT chest abdomen pelvis angiogram, 03/01/2021 FINDINGS: VASCULAR Redemonstrated, partially imaged descending stent endograft repair of the thoracic and upper abdominal aorta, with stenting of the aortic branch vessel origins. 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 The abdominal aorta is nonaneurysmal. 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 Excluded aneurysm sac diameter is not significantly changed compared to recent prior examination, measuring approximately 6.3 x 5.8 cm. 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 Review of the MIP images confirms the above findings. NON-VASCULAR Lower chest: Cardiomegaly Hepatobiliary: No solid liver abnormality is seen. Small gallstones in the gallbladder fundus. Gallbladder wall thickening, or biliary dilatation. Pancreas: Unremarkable. No pancreatic ductal dilatation or surrounding inflammatory changes. Spleen: Normal in size without significant abnormality. Adrenals/Urinary Tract: Adrenal glands are unremarkable. Kidneys are normal, without renal calculi, solid lesion, or hydronephrosis. Bladder is unremarkable. Stomach/Bowel: Stomach is within normal limits. Appendix appears normal. No evidence of bowel wall thickening, distention, or inflammatory changes. Redemonstrated postoperative  findings of multiple bowel resections and reanastomosis. Lymphatic: No enlarged abdominal or pelvic lymph nodes. Reproductive: Uterine fibroids. Other: No abdominal wall hernia or abnormality. No abdominopelvic ascites. Musculoskeletal: No acute or significant osseous findings. IMPRESSION: 1. Redemonstrated, partially imaged stent endograft repair of the thoracic and upper abdominal aorta, with stenting of the abdominal aortic branch vessel origins. 2. 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 Excluded aneurysm sac diameter is not significantly changed compared to recent prior examination, measuring approximately 6.3 x 5.8 cm. 3. 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 4. 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 5. No contrast extravasation into the bowel lumen to suggest nidus of reported GI bleeding. 6. Cholelithiasis without evidence of cholecystitis. 7. Redemonstrated postoperative findings of multiple bowel resections and reanastomosis. Electronically Signed   By: Erasmo Score.D.  On: 04/16/2021 16:06      Joycelyn Das, MD  [facility] 04/17/2021  If 7PM-7AM, please contact night-coverage

## 2021-04-17 NOTE — ED Notes (Signed)
Pt ambulated to toilet to urinate. Pt is having bloody diarrhea everytime she goes to the bathroom.

## 2021-04-18 ENCOUNTER — Other Ambulatory Visit: Payer: Self-pay

## 2021-04-18 ENCOUNTER — Inpatient Hospital Stay: Payer: Medicaid Other

## 2021-04-18 ENCOUNTER — Encounter: Payer: Self-pay | Admitting: Gastroenterology

## 2021-04-18 DIAGNOSIS — K921 Melena: Secondary | ICD-10-CM

## 2021-04-18 LAB — HEMOGLOBIN AND HEMATOCRIT, BLOOD
HCT: 21.8 % — ABNORMAL LOW (ref 36.0–46.0)
HCT: 25.8 % — ABNORMAL LOW (ref 36.0–46.0)
Hemoglobin: 6.8 g/dL — ABNORMAL LOW (ref 12.0–15.0)
Hemoglobin: 8.2 g/dL — ABNORMAL LOW (ref 12.0–15.0)

## 2021-04-18 LAB — HIV ANTIBODY (ROUTINE TESTING W REFLEX): HIV Screen 4th Generation wRfx: NONREACTIVE

## 2021-04-18 LAB — PREPARE RBC (CROSSMATCH)

## 2021-04-18 MED ORDER — SODIUM CHLORIDE 0.9% IV SOLUTION: Freq: Once | INTRAVENOUS | Status: AC

## 2021-04-18 MED ORDER — TECHNETIUM TC 99M-LABELED RED BLOOD CELLS IV KIT
20.0000 | PACK | Freq: Once | INTRAVENOUS | Status: AC | PRN
  Administered 2021-04-18: 22.44 via INTRAVENOUS

## 2021-04-18 MED ORDER — GOLYTELY 236 G PO SOLR: 4000.0000 mL | Freq: Once | ORAL | 0 refills | Status: AC

## 2021-04-18 NOTE — Progress Notes (Signed)
PROGRESS NOTE  Jamie Roman:[id number] DOB: August 17, 1960 DOA: 04/16/2021 PCP: [facility]   LOS: 1 day   Brief narrative:  Jamie Roman is a 60 y.o. 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  No previous history of GI bleed but takes baby aspirin.  No recent use of NSAIDs. 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 via Dr. Karie Mainland of vascular surgery.  At baseline, patient's hemoglobin is between 10-12.  EKG showed a sinus rhythm.  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  ED provider had spoken with the vascular surgery at [facility] and noted interval improvement in endoleak compared to CTA 03/01/2021.  [facility] vascular surgery did not think that her GI bleed was related to AAA repair.  No transfer was requested.  In the ED, patient was given Protonix 80 mg, type and screen was done and patient received 3 units of packed RBC.  Patient was then admitted to hospital for further evaluation and treatment.  Assessment/Plan:  Principal Problem:   Acute upper GI bleed Active Problems:   HTN (hypertension), benign   Acute blood loss anemia   COPD (chronic obstructive pulmonary disease) (HCC)   HLD (hyperlipidemia)   Tobacco abuse   GI bleed   AVM (arteriovenous malformation) of small bowel, acquired   Acute Upper GI Bleed:  Ongoing GI bleed.  Latest hemoglobin of 6.8.  Patient will receive 2 more units of packed RBC today.  Received 3 units after admission..  Was on aspirin and Plavix as outpatient.  GI on board and underwent EGD on 04/17/2021 with findings of nonbleeding AVMs in the duodenum which was treated with APC.  CTA of the abdomen yesterday did not show any active  area of bleed so RBC scan has been ordered by GI.  Hemodynamically stable so far.  Continue NPO.  IV fluids.  IV Protonix.    Acute blood loss anemia:  Continue PRBC transfusion.  We will add 2 more units of PRBC today.  Monitor CBC closely.  Follow GI recommendations.    COPD:  Continue Symbicort as well as as needed albuterol inhaler.  Compensated at this time.  Essential hypertension:  Norvasc Coreg HCTZ and losartan on hold at this time.  Will resume when p.o. okay.  As needed antihypertensives for now    Hyperlipidemia:  Continue Lipitor.    Chronic tobacco abuse: Currently smoking.  Counseled about cessation.   DVT prophylaxis: SCDs Start: 04/16/21 1844   Code Status: Full  Family Communication: None  Status is: Inpatient  The patient is inpatient because: IV treatments appropriate due to intensity of illness or inability to take PO and Inpatient level of care appropriate due to severity of illness  Dispo: The patient is from: Home              Anticipated d/c is to: Home              Patient currently is not medically stable to d/c.   Difficult to place patient No Consultants: GI  Procedures: PRBC transfusion CTA abdomen EGD with APC fulguration of AVMs on 04/17/2021.  Anti-infectives:  None  Anti-infectives (From admission, onward)    None      Subjective: Today, patient  was seen and examined at bedside.  Patient complains of mild epigastric discomfort but has not had a bowel movement today.  Denies any nausea vomiting fever chills or shortness of breath. Objective: Vitals:   04/18/21 0832 04/18/21 1159  BP: (!) 146/65 (!) 150/66  Pulse: 71 75  Resp: 16 16  Temp:  98.3 F (36.8 C)  SpO2: 99% 100%    Intake/Output Summary (Last 24 hours) at 04/18/2021 1305 Last data filed at 04/18/2021 1159 Gross per 24 hour  Intake 410 ml  Output 2 ml  Net 408 ml    Filed Weights   04/16/21 1121 04/17/21 1855 04/18/21 0500  Weight: 52.2 kg 52.6 kg 53.4 kg    Body mass index is 21.53 kg/m.   Physical Exam: GENERAL: Patient is alert awake and oriented. Not in obvious distress. HENT: Mild pallor noted, pupils equally reactive to light. Oral mucosa is moist NECK: is supple, no gross swelling noted. CHEST: Clear to auscultation. No crackles or wheezes.  Diminished breath sounds bilaterally. CVS: S1 and S2 heard, no murmur. Regular rate and rhythm.  ABDOMEN: Soft, mild nonspecific tenderness of the abdomen noted, bowel sounds are present. EXTREMITIES: No edema. CNS: Cranial nerves are intact. No focal motor deficits. SKIN: warm and dry without rashes.  Data Review: I have personally reviewed the following laboratory data and studies,  CBC: Recent Labs  Lab 04/16/21 1134 04/17/21 0258 04/17/21 1115 04/17/21 1852 04/18/21 0357  WBC 6.1  --   --  7.8  --   HGB 5.1* 6.9* 8.1* 7.4* 6.8*  HCT 16.5* 20.8* 24.7* 22.0* 21.8*  MCV 78.2*  --   --  81.5  --   PLT 272  --   --  247  --     Basic Metabolic Panel: Recent Labs  Lab 04/16/21 1134 04/17/21 0258 04/17/21 1852  NA 139  --  142  K 4.0  --  3.8  CL 107  --  114*  CO2 25  --  22  GLUCOSE 123*  --  92  BUN 39*  --  26*  CREATININE 1.00  --  0.81  CALCIUM 8.8*  --  8.8*  MG  --  2.2 2.0    Liver Function Tests: Recent Labs  Lab 04/16/21 1134 04/17/21 1852  AST 17 16  ALT 9 8  ALKPHOS 67 61  BILITOT 0.4 0.8  PROT 6.4* 6.2*  ALBUMIN 3.0* 2.8*    No results for input(s): LIPASE, AMYLASE in the last 168 hours. No results for input(s): AMMONIA in the last 168 hours. Cardiac Enzymes: No results for input(s): CKTOTAL, CKMB, CKMBINDEX, TROPONINI in the last 168 hours. BNP (last 3 results) No results for input(s): BNP in the last 8760 hours.  ProBNP (last 3 results) No results for input(s): PROBNP in the last 8760 hours.  CBG: No results for input(s): GLUCAP in the last 168 hours. Recent Results (from the past 240 hour(s))  Resp Panel by RT-PCR (Flu A&B, Covid)  Nasopharyngeal Swab     Status: None   Collection Time: 04/16/21  9:08 PM   Specimen: Nasopharyngeal Swab; Nasopharyngeal(NP) swabs in vial transport medium  Result Value Ref Range Status   SARS Coronavirus 2 by RT PCR NEGATIVE NEGATIVE Final    Comment: (NOTE) SARS-CoV-2 target nucleic acids are NOT DETECTED.  The SARS-CoV-2 RNA is generally detectable in upper respiratory specimens during the acute phase of infection. The lowest concentration of SARS-CoV-2 viral copies this assay can detect is 138  copies/mL. A negative result does not preclude SARS-Cov-2 infection and should not be used as the sole basis for treatment or other patient management decisions. A negative result may occur with  improper specimen collection/handling, submission of specimen other than nasopharyngeal swab, presence of viral mutation(s) within the areas targeted by this assay, and inadequate number of viral copies(<138 copies/mL). A negative result must be combined with clinical observations, patient history, and epidemiological information. The expected result is Negative.  Fact Sheet for Patients:  BloggerCourse.com  Fact Sheet for Healthcare Providers:  SeriousBroker.it  This test is no t yet approved or cleared by the [place] FDA and  has been authorized for detection and/or diagnosis of SARS-CoV-2 by FDA under an Emergency Use Authorization (EUA). This EUA will remain  in effect (meaning this test can be used) for the duration of the COVID-19 declaration under Section 564(b)(1) of the Act, 21 U.S.C.section 360bbb-3(b)(1), unless the authorization is terminated  or revoked sooner.       Influenza A by PCR NEGATIVE NEGATIVE Final   Influenza B by PCR NEGATIVE NEGATIVE Final    Comment: (NOTE) The Xpert Xpress SARS-CoV-2/FLU/RSV plus assay is intended as an aid in the diagnosis of influenza from Nasopharyngeal swab specimens and should not be  used as a sole basis for treatment. Nasal washings and aspirates are unacceptable for Xpert Xpress SARS-CoV-2/FLU/RSV testing.  Fact Sheet for Patients: BloggerCourse.com  Fact Sheet for Healthcare Providers: SeriousBroker.it  This test is not yet approved or cleared by the [place] FDA and has been authorized for detection and/or diagnosis of SARS-CoV-2 by FDA under an Emergency Use Authorization (EUA). This EUA will remain in effect (meaning this test can be used) for the duration of the COVID-19 declaration under Section 564(b)(1) of the Act, 21 U.S.C. section 360bbb-3(b)(1), unless the authorization is terminated or revoked.  Performed at [facility], [address] [place], [place] [address]       Studies: CT ANGIO GI BLEED  Result Date: 04/16/2021 CLINICAL DATA:  Black stools for 2 days, blood thinners, weakness, history of aortic dissection with stent endograft repair EXAM: CTA ABDOMEN AND PELVIS WITHOUT AND WITH CONTRAST TECHNIQUE: Multidetector CT imaging of the abdomen and pelvis was performed using the standard protocol during bolus administration of intravenous contrast. Multiplanar reconstructed images and MIPs were obtained and reviewed to evaluate the vascular anatomy. CONTRAST:  34mL OMNIPAQUE IOHEXOL 350 MG/ML SOLN COMPARISON:  CT chest abdomen pelvis angiogram, 03/01/2021 FINDINGS: VASCULAR Redemonstrated, partially imaged descending stent endograft repair of the thoracic and upper abdominal aorta, with stenting of the aortic branch vessel origins. 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 The abdominal aorta is nonaneurysmal. 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 Excluded aneurysm sac diameter is not significantly changed compared to recent prior examination, measuring approximately 6.3 x 5.8 cm. 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 Review of the MIP images confirms the above findings. NON-VASCULAR Lower chest: Cardiomegaly Hepatobiliary: No solid liver abnormality is seen. Small gallstones in the gallbladder fundus. Gallbladder wall thickening, or biliary dilatation. Pancreas: Unremarkable. No pancreatic ductal dilatation or surrounding inflammatory changes. Spleen: Normal in size without significant abnormality. Adrenals/Urinary Tract: Adrenal glands are unremarkable. Kidneys are normal, without renal calculi, solid lesion, or hydronephrosis. Bladder is unremarkable. Stomach/Bowel: Stomach is within normal limits. Appendix appears normal. No evidence of bowel wall thickening, distention, or inflammatory  changes. Redemonstrated postoperative findings of multiple bowel resections and reanastomosis. Lymphatic: No enlarged abdominal or pelvic lymph nodes. Reproductive: Uterine fibroids. Other: No abdominal wall hernia or abnormality. No abdominopelvic ascites. Musculoskeletal: No acute or significant osseous findings. IMPRESSION: 1. Redemonstrated, partially imaged stent endograft repair of the thoracic and upper abdominal aorta, with stenting of the abdominal aortic branch vessel origins. 2. 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 Excluded aneurysm sac diameter is not significantly changed compared to recent prior examination, measuring approximately 6.3 x 5.8 cm. 3. 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 4. 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 5. No contrast extravasation into the bowel lumen to suggest nidus of reported GI bleeding. 6. Cholelithiasis without evidence of cholecystitis. 7. Redemonstrated postoperative findings of multiple bowel resections and reanastomosis. Electronically Signed   By: Lauralyn Primes  M.D.   On: 04/16/2021 16:06      Joycelyn Das, MD  [facility] 04/18/2021  If 7PM-7AM, please contact night-coverage

## 2021-04-18 NOTE — Progress Notes (Addendum)
Patient signed AMA form. Daughter at bedside to transport patient to [facility] from [facility] per family request. Vitals WDL at time of discharge. Daughter dressed patient and transporting patient to car herself. All belongings sent with patient at time of departure.  MD Pokhrel aware patient left AMA.

## 2021-04-19 LAB — TYPE AND SCREEN
ABO/RH(D): O POS
Antibody Screen: NEGATIVE
Unit division: 0
Unit division: 0
Unit division: 0
Unit division: 0
Unit division: 0
Unit division: 0
Unit division: 0

## 2021-04-19 LAB — BPAM RBC
Blood Product Expiration Date: 202209202359
Blood Product Expiration Date: 202209212359
Blood Product Expiration Date: 202210162359
Blood Product Expiration Date: 202210172359
Blood Product Expiration Date: 202210172359
Blood Product Expiration Date: 202210172359
Blood Product Expiration Date: 202210182359
ISSUE DATE / TIME: 202209181520
ISSUE DATE / TIME: 202209182032
ISSUE DATE / TIME: 202209190622
ISSUE DATE / TIME: 202209200758
ISSUE DATE / TIME: 202209201007
Unit Type and Rh: 5100
Unit Type and Rh: 5100
Unit Type and Rh: 5100
Unit Type and Rh: 5100
Unit Type and Rh: 5100
Unit Type and Rh: 9500
Unit Type and Rh: 9500

## 2021-04-19 LAB — METHYLMALONIC ACID, SERUM: Methylmalonic Acid, Quantitative: 226 nmol/L (ref 0–378)

## 2021-04-19 SURGERY — COLONOSCOPY WITH PROPOFOL
Anesthesia: General

## 2021-04-19 NOTE — Discharge Summary (Signed)
Physician Discharge Summary  Jamie Roman ENI:[id number] DOB: Dec 02, 1960 DOA: 04/16/2021  PCP: [facility]  Admit date: 04/16/2021 Discharge date: 04/18/2021  Admitted From: Home  Discharge disposition: Patient left AGAINST MEDICAL ADVICE   Recommendations for Outpatient Follow-Up:   Patient left AGAINST MEDICAL ADVICE.  Discharge Diagnosis:   Principal Problem:   Acute upper GI bleed Active Problems:   HTN (hypertension), benign   Acute blood loss anemia   COPD (chronic obstructive pulmonary disease) (HCC)   HLD (hyperlipidemia)   Tobacco abuse   GI bleed   AVM (arteriovenous malformation) of small bowel, acquired   Discharge Condition: Left AGAINST MEDICAL ADVICE  Diet recommendation: Clears for colonoscopy but patient is leaving AGAINST MEDICAL ADVICE.  Wound care: None.  Code status: Full.   History of Present Illness:   Jamie Roman is a 60 y.o. 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  No previous history of GI bleed but takes baby aspirin.  No recent use of NSAIDs. 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 via Dr. Karie Mainland of vascular surgery.  At baseline, patient's hemoglobin is between 10-12.  EKG showed a sinus rhythm.  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  ED provider had spoken with the vascular surgery at [facility] and noted interval improvement in endoleak compared to CTA 03/01/2021.  [facility] vascular surgery did not think that her GI bleed was related to AAA repair.  No transfer was requested.  In the ED, patient was given Protonix 80 mg, type and screen was done and patient received 3 units of packed RBC.  Patient was then admitted  to hospital for further evaluation and treatment.   Hospital Course:   Following conditions were addressed during hospitalization as listed below,  Acute Upper GI Bleed:  Hemoglobin of 8.2 after PRBC transfusion.  Patient was on aspirin and Plavix as outpatient.  Patient underwent EGD on 04/17/2021 with findings of nonbleeding AVMs in the duodenum which was treated with APC.  CTA of the abdomen did not show any active area of bleed so RBC scan was done which showed active area of bleeding.  Spoke with GI who recommended colonoscopy versus IR guided angiogram and embolization if continued bleed, but patient's daughter insisted on signing out AGAINST MEDICAL ADVICE to go  to [facility].  Risk of deterioration was thoroughly explained to the patient's daughter but she has understood the risk of signing out AGAINST MEDICAL ADVICE.   Acute blood loss anemia:  Received PRBC transfusion units during hospitalization    COPD:  Remained compensated.  Essential hypertension:  Patient is on Norvasc Coreg HCTZ and losartan at home.   Hyperlipidemia:  On Lipitor at home   Chronic tobacco abuse: Currently smoking.    Disposition.  At this time, spite explaining the risk of deterioration patient has decided to leave AGAINST MEDICAL ADVICE.    Medical Consultants:   GI Procedures:    PRBC transfusion CTA abdomen EGD with APC fulguration of AVMs on 04/17/2021. RBC bleeding scan . Subjective:   Today, patient was seen and examined at bedside.  Has not had a bowel movement.  Patient's family wishing to leave AGAINST MEDICAL ADVICE  Discharge Exam:   Vitals:  04/18/21 0832 04/18/21 1159  BP: (!) 146/65 (!) 150/66  Pulse: 71 75  Resp: 16 16  Temp:  98.3 F (36.8 C)  SpO2: 99% 100%   Vitals:   04/18/21 0500 04/18/21 0809 04/18/21 0832 04/18/21 1159  BP:  (!) 151/72 (!) 146/65 (!) 150/66  Pulse:  70 71 75  Resp:  16 16 16   Temp:  98 F (36.7 C)  98.3 F (36.8 C)  TempSrc:  Oral  Oral Oral  SpO2:  100% 99% 100%  Weight: 53.4 kg     Height:       GENERAL: Patient is alert awake and oriented. Not in obvious distress. HENT: Mild pallor noted, pupils equally reactive to light. Oral mucosa is moist NECK: is supple, no gross swelling noted. CHEST: Clear to auscultation. No crackles or wheezes.  Diminished breath sounds bilaterally. CVS: S1 and S2 heard, no murmur. Regular rate and rhythm.  ABDOMEN: Soft, mild nonspecific tenderness of the abdomen noted, bowel sounds are present. EXTREMITIES: No edema. CNS: Cranial nerves are intact. No focal motor deficits. SKIN: warm and dry without rashes.   The results of significant diagnostics from this hospitalization (including imaging, microbiology, ancillary and laboratory) are listed below for reference.     Diagnostic Studies:   CT ANGIO GI BLEED  Result Date: 04/16/2021 CLINICAL DATA:  Black stools for 2 days, blood thinners, weakness, history of aortic dissection with stent endograft repair EXAM: CTA ABDOMEN AND PELVIS WITHOUT AND WITH CONTRAST TECHNIQUE: Multidetector CT imaging of the abdomen and pelvis was performed using the standard protocol during bolus administration of intravenous contrast. Multiplanar reconstructed images and MIPs were obtained and reviewed to evaluate the vascular anatomy. CONTRAST:  33mL OMNIPAQUE IOHEXOL 350 MG/ML SOLN COMPARISON:  CT chest abdomen pelvis angiogram, 03/01/2021 FINDINGS: VASCULAR Redemonstrated, partially imaged descending stent endograft repair of the thoracic and upper abdominal aorta, with stenting of the aortic branch vessel origins. 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 The abdominal aorta is nonaneurysmal. 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 Excluded aneurysm sac diameter is not significantly changed compared to recent prior examination, measuring approximately 6.3 x 5.8 cm.  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 Review of the MIP images confirms the above findings. NON-VASCULAR Lower chest: Cardiomegaly Hepatobiliary: No solid liver abnormality is seen. Small gallstones in the gallbladder fundus. Gallbladder wall thickening, or biliary dilatation. Pancreas: Unremarkable. No pancreatic ductal dilatation or surrounding inflammatory changes. Spleen: Normal in size without significant abnormality. Adrenals/Urinary Tract: Adrenal glands are unremarkable. Kidneys are normal, without renal calculi, solid lesion, or hydronephrosis. Bladder is unremarkable. Stomach/Bowel: Stomach is within normal limits. Appendix appears normal. No evidence of bowel wall thickening, distention, or inflammatory changes. Redemonstrated postoperative findings of multiple bowel resections and reanastomosis. Lymphatic: No enlarged abdominal or pelvic lymph nodes. Reproductive: Uterine fibroids. Other: No abdominal wall hernia or abnormality. No abdominopelvic ascites. Musculoskeletal: No acute or significant osseous findings. IMPRESSION: 1. Redemonstrated, partially imaged stent endograft repair of the thoracic and upper abdominal aorta, with stenting of the abdominal aortic branch vessel origins. 2. 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 Excluded aneurysm sac diameter is not significantly changed compared to recent prior examination, measuring approximately 6.3 x 5.8 cm. 3. 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 4. 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 5. No contrast extravasation into the bowel lumen to suggest nidus of reported GI bleeding. 6. Cholelithiasis without evidence of cholecystitis. 7. Redemonstrated postoperative findings of multiple bowel resections and reanastomosis. Electronically Signed   By:  Lauralyn Primes M.D.   On: 04/16/2021 16:06     Labs:   Basic Metabolic Panel: Recent Labs  Lab 04/16/21 1134 04/17/21 0258 04/17/21 1852  NA 139  --  142  K 4.0  --  3.8  CL 107  --  114*  CO2 25  --  22  GLUCOSE 123*  --  92  BUN 39*  --  26*  CREATININE 1.00  --  0.81  CALCIUM 8.8*  --  8.8*  MG  --  2.2 2.0   GFR Estimated Creatinine Clearance: 57 mL/min (by C-G formula based on SCr of 0.81 mg/dL). Liver Function Tests: Recent Labs  Lab 04/16/21 1134 04/17/21 1852  AST 17 16  ALT 9 8  ALKPHOS 67 61  BILITOT 0.4 0.8  PROT 6.4* 6.2*  ALBUMIN 3.0* 2.8*   No results for input(s): LIPASE, AMYLASE in the last 168 hours. No results for input(s): AMMONIA in the last 168 hours. Coagulation profile Recent Labs  Lab 04/17/21 0258 04/17/21 1852  INR 1.1 1.0    CBC: Recent Labs  Lab 04/16/21 1134 04/17/21 0258 04/17/21 1115 04/17/21 1852 04/18/21 0357 04/18/21 1654  WBC 6.1  --   --  7.8  --   --   HGB 5.1* 6.9* 8.1* 7.4* 6.8* 8.2*  HCT 16.5* 20.8* 24.7* 22.0* 21.8* 25.8*  MCV 78.2*  --   --  81.5  --   --   PLT 272  --   --  247  --   --    Cardiac Enzymes: No results for input(s): CKTOTAL, CKMB, CKMBINDEX, TROPONINI in the last 168 hours. BNP: Invalid input(s): POCBNP CBG: No results for input(s): GLUCAP in the last 168 hours. D-Dimer No results for input(s): DDIMER in the last 72 hours. Hgb A1c No results for input(s): HGBA1C in the last 72 hours. Lipid Profile No results for input(s): CHOL, HDL, LDLCALC, TRIG, CHOLHDL, LDLDIRECT in the last 72 hours. Thyroid function studies No results for input(s): TSH, T4TOTAL, T3FREE, THYROIDAB in the last 72 hours.  Invalid input(s): FREET3 Anemia work up Recent Labs    04/17/21 0258  FOLATE 22.7  FERRITIN 47  TIBC 274  IRON 105   Microbiology Recent Results (from the past 240 hour(s))  Resp Panel by RT-PCR (Flu A&B, Covid) Nasopharyngeal Swab     Status: None   Collection Time: 04/16/21  9:08 PM    Specimen: Nasopharyngeal Swab; Nasopharyngeal(NP) swabs in vial transport medium  Result Value Ref Range Status   SARS Coronavirus 2 by RT PCR NEGATIVE NEGATIVE Final    Comment: (NOTE) SARS-CoV-2 target nucleic acids are NOT DETECTED.  The SARS-CoV-2 RNA is generally detectable in upper respiratory specimens during the acute phase of infection. The lowest concentration of SARS-CoV-2 viral copies this assay can detect is 138 copies/mL. A negative result does not preclude SARS-Cov-2 infection and should not be used as the sole basis for treatment or other patient management decisions. A negative result may occur with  improper specimen collection/handling, submission of specimen other than nasopharyngeal swab, presence of viral mutation(s) within the areas targeted by this assay, and inadequate number of viral copies(<138 copies/mL). A negative result must be combined with clinical observations, patient history,  and epidemiological information. The expected result is Negative.  Fact Sheet for Patients:  BloggerCourse.com  Fact Sheet for Healthcare Providers:  SeriousBroker.it  This test is no t yet approved or cleared by the [place] FDA and  has been authorized for detection and/or diagnosis of SARS-CoV-2 by FDA under an Emergency Use Authorization (EUA). This EUA will remain  in effect (meaning this test can be used) for the duration of the COVID-19 declaration under Section 564(b)(1) of the Act, 21 U.S.C.section 360bbb-3(b)(1), unless the authorization is terminated  or revoked sooner.       Influenza A by PCR NEGATIVE NEGATIVE Final   Influenza B by PCR NEGATIVE NEGATIVE Final    Comment: (NOTE) The Xpert Xpress SARS-CoV-2/FLU/RSV plus assay is intended as an aid in the diagnosis of influenza from Nasopharyngeal swab specimens and should not be used as a sole basis for treatment. Nasal washings and aspirates are  unacceptable for Xpert Xpress SARS-CoV-2/FLU/RSV testing.  Fact Sheet for Patients: BloggerCourse.com  Fact Sheet for Healthcare Providers: SeriousBroker.it  This test is not yet approved or cleared by the [place] FDA and has been authorized for detection and/or diagnosis of SARS-CoV-2 by FDA under an Emergency Use Authorization (EUA). This EUA will remain in effect (meaning this test can be used) for the duration of the COVID-19 declaration under Section 564(b)(1) of the Act, 21 U.S.C. section 360bbb-3(b)(1), unless the authorization is terminated or revoked.  Performed at [facility], [address] [place], [place] [address]      Discharge Instructions:    Allergies as of 04/18/2021   No Known Allergies      Medication List     ASK your doctor about these medications    acetaminophen 325 MG tablet Commonly known as: TYLENOL Take 2 tablets (650 mg total) by mouth every 6 (six) hours as needed for mild pain (or Fever >/= 101).   acidophilus Caps capsule Take 2 capsules by mouth 2 (two) times daily.   albuterol 108 (90 Base) MCG/ACT inhaler Commonly known as: VENTOLIN HFA Inhale 2 puffs into the lungs every 6 (six) hours as needed for wheezing or shortness of breath.   amLODipine 10 MG tablet Commonly known as: NORVASC Take 1 tablet (10 mg total) by mouth daily.   Aspirin Low Dose 81 MG EC tablet Generic drug: aspirin Take 81 mg by mouth daily.   atorvastatin 40 MG tablet Commonly known as: LIPITOR Take 40 mg by mouth at bedtime.   atorvastatin 80 MG tablet Commonly known as: LIPITOR Take 80 mg by mouth daily.   carvedilol 25 MG tablet Commonly known as: COREG Take 25 mg by mouth 2 (two) times daily.   clopidogrel 75 MG tablet Commonly known as: PLAVIX Take 75 mg by mouth daily.   doxycycline 100 MG tablet Commonly known as: VIBRA-TABS Take 1 tablet (100 mg total) by mouth every  12 (twelve) hours.   enoxaparin 40 MG/0.4ML injection Commonly known as: LOVENOX Inject 0.4 mLs (40 mg total) into the skin daily for 14 days.   fluticasone-salmeterol 250-50 MCG/ACT Aepb Commonly known as: ADVAIR Inhale 1 puff into the lungs 2 (two) times daily.   Golytely 236 g solution Generic drug: polyethylene glycol Take 4,000 mLs by mouth once for 1 dose. Ask about: Should I take this medication?   guaiFENesin 600 MG 12 hr tablet Commonly known as: MUCINEX Take 1 tablet (600 mg total) by mouth 2 (two) times daily.   hydrochlorothiazide 12.5 MG capsule Commonly  known as: MICROZIDE Take 12.5 mg by mouth daily.   latanoprost 0.005 % ophthalmic solution Commonly known as: XALATAN 1 drop at bedtime.   losartan 100 MG tablet Commonly known as: COZAAR Take 100 mg by mouth daily.   Oxycodone HCl 10 MG Tabs Take 10 mg by mouth every 4 (four) hours as needed.   pantoprazole 20 MG tablet Commonly known as: PROTONIX Take 1 tablet (20 mg total) by mouth daily.   Spiriva HandiHaler 18 MCG inhalation capsule Generic drug: tiotropium 1 capsule daily.   Symbicort 80-4.5 MCG/ACT inhaler Generic drug: budesonide-formoterol Inhale 2 puffs into the lungs 2 (two) times daily.          Time coordinating discharge: 39 minutes  Signed:  Shamecca Whitebread  [facility] 04/19/2021, 3:42 PM

## 2021-10-30 ENCOUNTER — Other Ambulatory Visit (HOSPITAL_COMMUNITY): Payer: Self-pay | Admitting: Student

## 2022-05-22 IMAGING — NM NM GI BLOOD LOSS
1 series · 6 of 6 positions shown · non-contrast
Comparison: CTA GI bleed 04/16/2021

CLINICAL DATA: For GI bleed since [DATE] off and on, diarrhea and
melena last several days, today small bowel movement with dark clot,
anemia, transfused

EXAM:
NUCLEAR MEDICINE GASTROINTESTINAL BLEEDING SCAN
TECHNIQUE: Sequential abdominal images were obtained following intravenous
administration of Mc-OOm labeled red blood cells.
RADIOPHARMACEUTICALS:  22.44 mCi Mc-OOm pertechnetate in-vitro
labeled red cells.

[Series 1000: gi bleed · 4.80mm/px · 6 of 60 frames shown]
[frame 6/60]
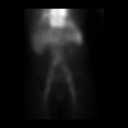
[frame 16/60]
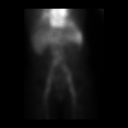
[frame 26/60]
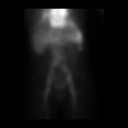
[frame 36/60]
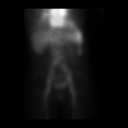
[frame 46/60]
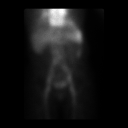
[frame 56/60]
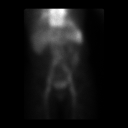

[6 of 6 positions shown; findings below may reference images not displayed]

FINDINGS: Normal initial blood pool distribution of labeled red cells.

At 27 minutes a focus of abnormal tracer accumulation appears in the
LEFT upper quadrant laterally.

Over the remainder of the exam this extends inferiorly into the LEFT
lower quadrant.

The course of the tracer favors active bleeding at the proximal
descending colon.
IMPRESSION: Abnormal focus of labeled red cells in the LEFT upper quadrant
extending inferiorly in the lateral LEFT mid abdomen to the LEFT
lower quadrant, favoring proximal descending colonic source of
active bleeding.
